# Patient Record
Sex: Female | Born: 1960 | Race: White | Hispanic: No | Marital: Single
Health system: Southern US, Community
[De-identification: ages and names within clinical notes are randomized; demographics above are authoritative.]

## PROBLEM LIST (undated history)

## (undated) DIAGNOSIS — M1990 Unspecified osteoarthritis, unspecified site: Secondary | ICD-10-CM

## (undated) DIAGNOSIS — E079 Disorder of thyroid, unspecified: Secondary | ICD-10-CM

## (undated) DIAGNOSIS — I10 Essential (primary) hypertension: Secondary | ICD-10-CM

## (undated) HISTORY — PX: ABDOMINAL HYSTERECTOMY: SHX81

## (undated) HISTORY — PX: APPENDECTOMY: SHX54

## (undated) HISTORY — PX: SKIN GRAFT: SHX250

## (undated) HISTORY — PX: THYROIDECTOMY, PARTIAL: SHX18

## (undated) HISTORY — PX: CYST REMOVAL HAND: SHX6279

---

## 2017-07-02 DIAGNOSIS — Z780 Asymptomatic menopausal state: Secondary | ICD-10-CM | POA: Diagnosis not present

## 2017-07-02 DIAGNOSIS — I10 Essential (primary) hypertension: Secondary | ICD-10-CM | POA: Diagnosis not present

## 2017-07-02 DIAGNOSIS — Z1231 Encounter for screening mammogram for malignant neoplasm of breast: Secondary | ICD-10-CM | POA: Diagnosis not present

## 2017-07-02 DIAGNOSIS — E039 Hypothyroidism, unspecified: Secondary | ICD-10-CM | POA: Diagnosis not present

## 2017-07-02 DIAGNOSIS — E538 Deficiency of other specified B group vitamins: Secondary | ICD-10-CM | POA: Diagnosis not present

## 2017-07-02 DIAGNOSIS — H547 Unspecified visual loss: Secondary | ICD-10-CM | POA: Diagnosis not present

## 2017-07-23 ENCOUNTER — Other Ambulatory Visit: Payer: Self-pay | Admitting: Internal Medicine

## 2017-07-23 DIAGNOSIS — E039 Hypothyroidism, unspecified: Secondary | ICD-10-CM | POA: Diagnosis not present

## 2017-07-23 DIAGNOSIS — I10 Essential (primary) hypertension: Secondary | ICD-10-CM | POA: Diagnosis not present

## 2017-07-23 DIAGNOSIS — E041 Nontoxic single thyroid nodule: Secondary | ICD-10-CM | POA: Diagnosis not present

## 2017-07-23 DIAGNOSIS — E538 Deficiency of other specified B group vitamins: Secondary | ICD-10-CM | POA: Diagnosis not present

## 2017-07-24 ENCOUNTER — Ambulatory Visit
Admission: RE | Admit: 2017-07-24 | Discharge: 2017-07-24 | Disposition: A | Discharge status: Home / Self Care | Payer: Medicare Other | Source: Ambulatory Visit | Attending: Internal Medicine | Admitting: Internal Medicine

## 2017-07-24 DIAGNOSIS — E041 Nontoxic single thyroid nodule: Secondary | ICD-10-CM

## 2017-08-14 DIAGNOSIS — H47323 Drusen of optic disc, bilateral: Secondary | ICD-10-CM | POA: Diagnosis not present

## 2017-08-14 DIAGNOSIS — H35033 Hypertensive retinopathy, bilateral: Secondary | ICD-10-CM | POA: Diagnosis not present

## 2017-10-22 ENCOUNTER — Other Ambulatory Visit: Payer: Self-pay | Admitting: Internal Medicine

## 2017-10-22 DIAGNOSIS — Z1231 Encounter for screening mammogram for malignant neoplasm of breast: Secondary | ICD-10-CM

## 2017-10-22 DIAGNOSIS — Z0000 Encounter for general adult medical examination without abnormal findings: Secondary | ICD-10-CM | POA: Diagnosis not present

## 2017-10-22 DIAGNOSIS — E039 Hypothyroidism, unspecified: Secondary | ICD-10-CM | POA: Diagnosis not present

## 2017-10-22 DIAGNOSIS — Z23 Encounter for immunization: Secondary | ICD-10-CM | POA: Diagnosis not present

## 2017-10-22 DIAGNOSIS — E041 Nontoxic single thyroid nodule: Secondary | ICD-10-CM | POA: Diagnosis not present

## 2017-10-22 DIAGNOSIS — E538 Deficiency of other specified B group vitamins: Secondary | ICD-10-CM | POA: Diagnosis not present

## 2017-11-22 ENCOUNTER — Ambulatory Visit
Admission: RE | Admit: 2017-11-22 | Discharge: 2017-11-22 | Disposition: A | Discharge status: Home / Self Care | Payer: Medicare Other | Source: Ambulatory Visit | Attending: Internal Medicine | Admitting: Internal Medicine

## 2017-11-22 ENCOUNTER — Encounter (INDEPENDENT_AMBULATORY_CARE_PROVIDER_SITE_OTHER): Payer: Self-pay

## 2017-11-22 DIAGNOSIS — Z1231 Encounter for screening mammogram for malignant neoplasm of breast: Secondary | ICD-10-CM | POA: Diagnosis not present

## 2018-01-15 DIAGNOSIS — Z780 Asymptomatic menopausal state: Secondary | ICD-10-CM | POA: Diagnosis not present

## 2018-01-15 DIAGNOSIS — E039 Hypothyroidism, unspecified: Secondary | ICD-10-CM | POA: Diagnosis not present

## 2018-01-15 DIAGNOSIS — Z0000 Encounter for general adult medical examination without abnormal findings: Secondary | ICD-10-CM | POA: Diagnosis not present

## 2018-01-15 DIAGNOSIS — I10 Essential (primary) hypertension: Secondary | ICD-10-CM | POA: Diagnosis not present

## 2018-01-15 DIAGNOSIS — R945 Abnormal results of liver function studies: Secondary | ICD-10-CM | POA: Diagnosis not present

## 2018-01-15 DIAGNOSIS — Z7189 Other specified counseling: Secondary | ICD-10-CM | POA: Diagnosis not present

## 2018-01-15 DIAGNOSIS — E538 Deficiency of other specified B group vitamins: Secondary | ICD-10-CM | POA: Diagnosis not present

## 2018-01-15 DIAGNOSIS — Z1389 Encounter for screening for other disorder: Secondary | ICD-10-CM | POA: Diagnosis not present

## 2018-02-18 ENCOUNTER — Other Ambulatory Visit: Payer: Self-pay | Admitting: Internal Medicine

## 2018-02-18 DIAGNOSIS — G629 Polyneuropathy, unspecified: Secondary | ICD-10-CM | POA: Diagnosis not present

## 2018-02-18 DIAGNOSIS — R7989 Other specified abnormal findings of blood chemistry: Secondary | ICD-10-CM

## 2018-02-18 DIAGNOSIS — R945 Abnormal results of liver function studies: Principal | ICD-10-CM

## 2018-02-18 DIAGNOSIS — R740 Nonspecific elevation of levels of transaminase and lactic acid dehydrogenase [LDH]: Secondary | ICD-10-CM | POA: Diagnosis not present

## 2018-02-18 DIAGNOSIS — I872 Venous insufficiency (chronic) (peripheral): Secondary | ICD-10-CM | POA: Diagnosis not present

## 2018-02-19 ENCOUNTER — Ambulatory Visit
Admission: RE | Admit: 2018-02-19 | Discharge: 2018-02-19 | Disposition: A | Discharge status: Home / Self Care | Payer: Medicare Other | Source: Ambulatory Visit | Attending: Internal Medicine | Admitting: Internal Medicine

## 2018-02-19 DIAGNOSIS — R945 Abnormal results of liver function studies: Principal | ICD-10-CM

## 2018-02-19 DIAGNOSIS — R7989 Other specified abnormal findings of blood chemistry: Secondary | ICD-10-CM

## 2018-02-19 DIAGNOSIS — K8020 Calculus of gallbladder without cholecystitis without obstruction: Secondary | ICD-10-CM | POA: Diagnosis not present

## 2018-02-25 ENCOUNTER — Other Ambulatory Visit: Payer: Self-pay | Admitting: Internal Medicine

## 2018-02-25 DIAGNOSIS — R740 Nonspecific elevation of levels of transaminase and lactic acid dehydrogenase [LDH]: Principal | ICD-10-CM

## 2018-02-25 DIAGNOSIS — R7401 Elevation of levels of liver transaminase levels: Secondary | ICD-10-CM

## 2018-02-26 ENCOUNTER — Ambulatory Visit
Admission: RE | Admit: 2018-02-26 | Discharge: 2018-02-26 | Disposition: A | Discharge status: Home / Self Care | Payer: Medicare Other | Source: Ambulatory Visit | Attending: Internal Medicine | Admitting: Internal Medicine

## 2018-02-26 ENCOUNTER — Other Ambulatory Visit: Payer: Medicare Other

## 2018-02-26 DIAGNOSIS — R7989 Other specified abnormal findings of blood chemistry: Secondary | ICD-10-CM | POA: Diagnosis not present

## 2018-02-26 DIAGNOSIS — K8020 Calculus of gallbladder without cholecystitis without obstruction: Secondary | ICD-10-CM | POA: Diagnosis not present

## 2018-02-26 DIAGNOSIS — R7401 Elevation of levels of liver transaminase levels: Secondary | ICD-10-CM

## 2018-02-26 DIAGNOSIS — R740 Nonspecific elevation of levels of transaminase and lactic acid dehydrogenase [LDH]: Principal | ICD-10-CM

## 2018-02-26 MED ORDER — IOPAMIDOL (ISOVUE-300) INJECTION 61%
125.0000 mL | Freq: Once | INTRAVENOUS | Status: AC | PRN
  Administered 2018-02-26: 125 mL via INTRAVENOUS

## 2018-03-05 DIAGNOSIS — K760 Fatty (change of) liver, not elsewhere classified: Secondary | ICD-10-CM | POA: Diagnosis not present

## 2018-03-05 DIAGNOSIS — R748 Abnormal levels of other serum enzymes: Secondary | ICD-10-CM | POA: Diagnosis not present

## 2018-03-05 DIAGNOSIS — Z1211 Encounter for screening for malignant neoplasm of colon: Secondary | ICD-10-CM | POA: Diagnosis not present

## 2018-07-16 DIAGNOSIS — N200 Calculus of kidney: Secondary | ICD-10-CM | POA: Diagnosis not present

## 2018-07-16 DIAGNOSIS — K760 Fatty (change of) liver, not elsewhere classified: Secondary | ICD-10-CM | POA: Diagnosis not present

## 2018-07-16 DIAGNOSIS — I10 Essential (primary) hypertension: Secondary | ICD-10-CM | POA: Diagnosis not present

## 2018-07-16 DIAGNOSIS — E039 Hypothyroidism, unspecified: Secondary | ICD-10-CM | POA: Diagnosis not present

## 2018-07-16 DIAGNOSIS — R748 Abnormal levels of other serum enzymes: Secondary | ICD-10-CM | POA: Diagnosis not present

## 2018-07-16 DIAGNOSIS — Z1211 Encounter for screening for malignant neoplasm of colon: Secondary | ICD-10-CM | POA: Diagnosis not present

## 2018-08-13 DIAGNOSIS — D124 Benign neoplasm of descending colon: Secondary | ICD-10-CM | POA: Diagnosis not present

## 2018-08-13 DIAGNOSIS — Z1211 Encounter for screening for malignant neoplasm of colon: Secondary | ICD-10-CM | POA: Diagnosis not present

## 2018-08-16 DIAGNOSIS — D124 Benign neoplasm of descending colon: Secondary | ICD-10-CM | POA: Diagnosis not present

## 2018-09-24 DIAGNOSIS — N202 Calculus of kidney with calculus of ureter: Secondary | ICD-10-CM | POA: Diagnosis not present

## 2018-11-19 DIAGNOSIS — Z23 Encounter for immunization: Secondary | ICD-10-CM | POA: Diagnosis not present

## 2019-02-04 ENCOUNTER — Other Ambulatory Visit: Payer: Self-pay | Admitting: Internal Medicine

## 2019-02-04 DIAGNOSIS — Z1231 Encounter for screening mammogram for malignant neoplasm of breast: Secondary | ICD-10-CM

## 2019-02-05 ENCOUNTER — Ambulatory Visit
Admission: RE | Admit: 2019-02-05 | Discharge: 2019-02-05 | Disposition: A | Discharge status: Home / Self Care | Payer: Medicare Other | Source: Ambulatory Visit

## 2019-02-05 ENCOUNTER — Other Ambulatory Visit: Payer: Self-pay

## 2019-02-05 DIAGNOSIS — Z1231 Encounter for screening mammogram for malignant neoplasm of breast: Secondary | ICD-10-CM

## 2019-04-24 ENCOUNTER — Ambulatory Visit: Payer: Medicare Other | Attending: Internal Medicine

## 2019-04-24 DIAGNOSIS — Z23 Encounter for immunization: Secondary | ICD-10-CM

## 2019-04-24 NOTE — Progress Notes (Signed)
   Covid-19 Vaccination Clinic  Name:  Pamela Jenkins    MRN: [id number] DOB: 21-Nov-1960  04/24/2019  [name] was observed post Covid-19 immunization for 30 minutes based on pre-vaccination screening without incident. She was provided with Vaccine Information Sheet and instruction to access the V-Safe system.   [name] was instructed to call 911 with any severe reactions post vaccine: Marland Kitchen Difficulty breathing  . Swelling of face and throat  . A fast heartbeat  . A bad rash all over body  . Dizziness and weakness   Immunizations Administered    Name Date Dose VIS Date Route   Pfizer COVID-19 Vaccine 04/24/2019  1:00 PM 0.3 mL 01/10/2019 Intramuscular   Manufacturer: [place]   Lot: [id number]   [place]: [id number]

## 2019-05-19 ENCOUNTER — Ambulatory Visit: Payer: Medicare Other | Attending: Internal Medicine

## 2019-05-19 DIAGNOSIS — Z23 Encounter for immunization: Secondary | ICD-10-CM

## 2019-05-19 NOTE — Progress Notes (Signed)
   Covid-19 Vaccination Clinic  Name:  Artice Goudeau    MRN: [id number] DOB: [birth year]  05/19/2019  Ms. Pross was observed post Covid-19 immunization for 30 minutes based on pre-vaccination screening without incident. She was provided with Vaccine Information Sheet and instruction to access the V-Safe system.   Ms. Hafen was instructed to call 911 with any severe reactions post vaccine: Marland Kitchen Difficulty breathing  . Swelling of face and throat  . A fast heartbeat  . A bad rash all over body  . Dizziness and weakness   Immunizations Administered    Name Date Dose VIS Date Route   Pfizer COVID-19 Vaccine 05/19/2019 10:58 AM 0.3 mL 03/26/2018 Intramuscular   Manufacturer: [place]   Lot: [id number]   [place]: [id number]

## 2020-02-13 ENCOUNTER — Other Ambulatory Visit: Payer: Self-pay | Admitting: Internal Medicine

## 2020-02-13 DIAGNOSIS — Z1231 Encounter for screening mammogram for malignant neoplasm of breast: Secondary | ICD-10-CM

## 2020-02-18 DIAGNOSIS — M1990 Unspecified osteoarthritis, unspecified site: Secondary | ICD-10-CM | POA: Diagnosis not present

## 2020-02-18 DIAGNOSIS — E039 Hypothyroidism, unspecified: Secondary | ICD-10-CM | POA: Diagnosis not present

## 2020-02-18 DIAGNOSIS — I10 Essential (primary) hypertension: Secondary | ICD-10-CM | POA: Diagnosis not present

## 2020-03-03 ENCOUNTER — Ambulatory Visit
Admission: RE | Admit: 2020-03-03 | Discharge: 2020-03-03 | Disposition: A | Discharge status: Home / Self Care | Payer: Medicare Other | Source: Ambulatory Visit

## 2020-03-03 ENCOUNTER — Other Ambulatory Visit: Payer: Self-pay

## 2020-03-03 DIAGNOSIS — Z1231 Encounter for screening mammogram for malignant neoplasm of breast: Secondary | ICD-10-CM

## 2020-03-18 ENCOUNTER — Other Ambulatory Visit: Payer: Self-pay | Admitting: Internal Medicine

## 2020-03-18 DIAGNOSIS — R739 Hyperglycemia, unspecified: Secondary | ICD-10-CM | POA: Diagnosis not present

## 2020-03-18 DIAGNOSIS — F3342 Major depressive disorder, recurrent, in full remission: Secondary | ICD-10-CM | POA: Diagnosis not present

## 2020-03-18 DIAGNOSIS — Z7189 Other specified counseling: Secondary | ICD-10-CM | POA: Diagnosis not present

## 2020-03-18 DIAGNOSIS — I10 Essential (primary) hypertension: Secondary | ICD-10-CM | POA: Diagnosis not present

## 2020-03-18 DIAGNOSIS — Z0000 Encounter for general adult medical examination without abnormal findings: Secondary | ICD-10-CM | POA: Diagnosis not present

## 2020-03-18 DIAGNOSIS — E2839 Other primary ovarian failure: Secondary | ICD-10-CM | POA: Diagnosis not present

## 2020-03-18 DIAGNOSIS — E559 Vitamin D deficiency, unspecified: Secondary | ICD-10-CM | POA: Diagnosis not present

## 2020-03-18 DIAGNOSIS — R7309 Other abnormal glucose: Secondary | ICD-10-CM | POA: Diagnosis not present

## 2020-03-18 DIAGNOSIS — Z1389 Encounter for screening for other disorder: Secondary | ICD-10-CM | POA: Diagnosis not present

## 2020-06-21 IMAGING — US US THYROID
1 series · 14 of 25 positions shown · non-contrast
Comparison: None available

CLINICAL DATA: Right nodule. History of biopsy at outside
institution. Previous left thyroid lobectomy for benign disease

EXAM:
THYROID ULTRASOUND
TECHNIQUE: Ultrasound examination of the thyroid gland and adjacent soft
tissues was performed.

[Series 1: us thyroid · 0.06mm/px · 36 acquisitions, 14 frames shown]
[im 1/36]
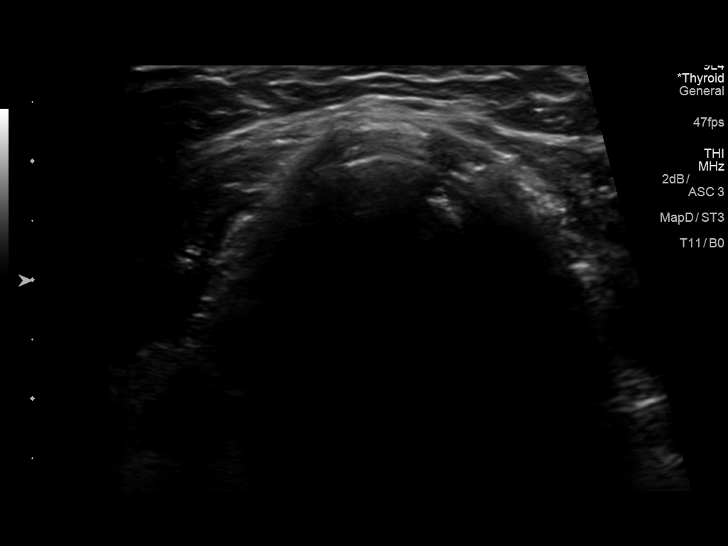
[im 3/36]
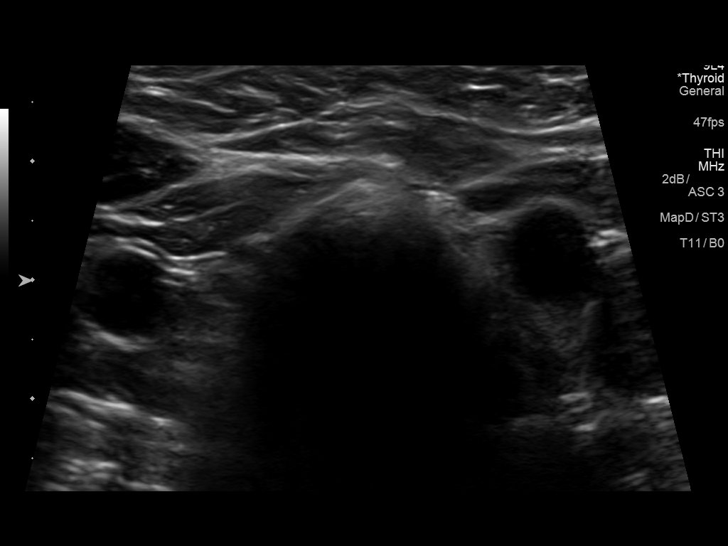
[im 6/36]
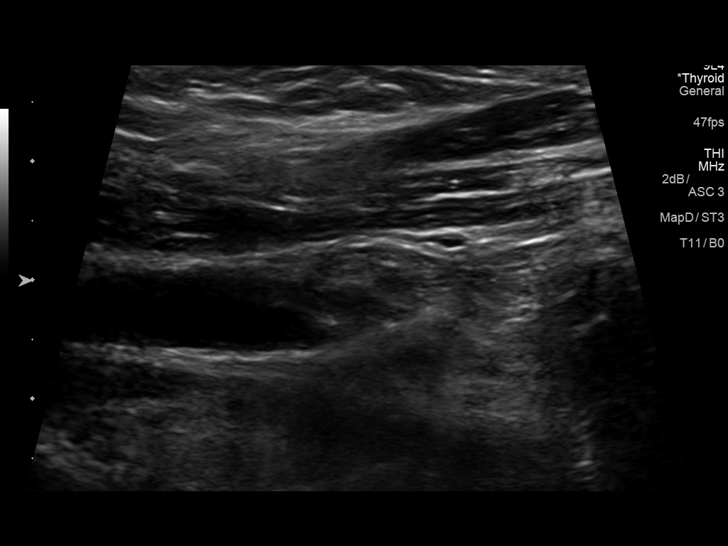
[im 9/36]
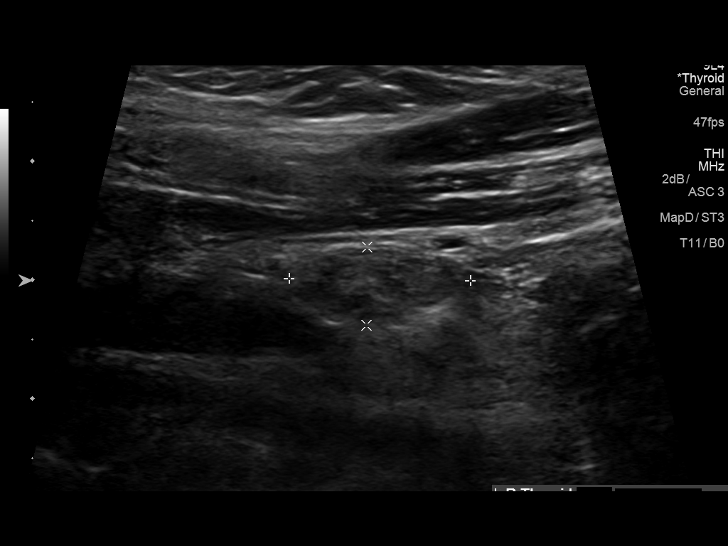
[im 12/36]
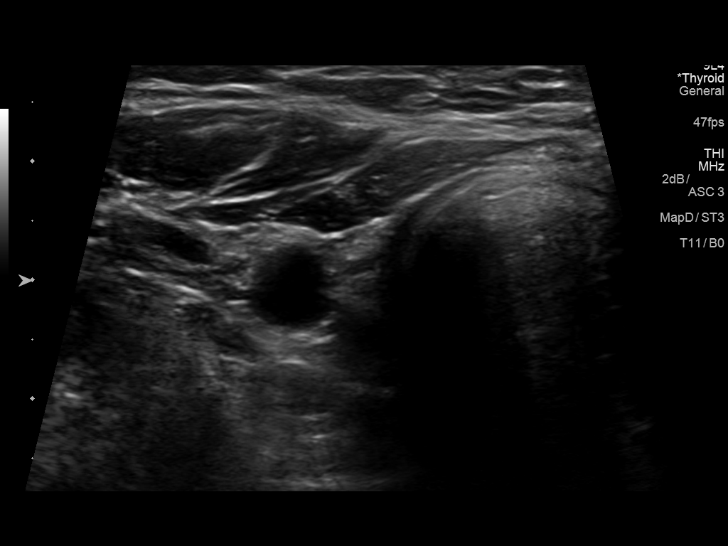
[im 14/36]
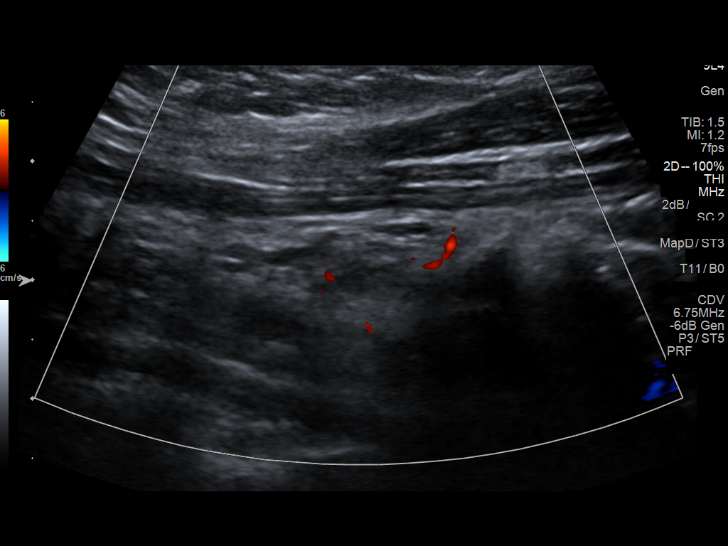
[im 17/36]
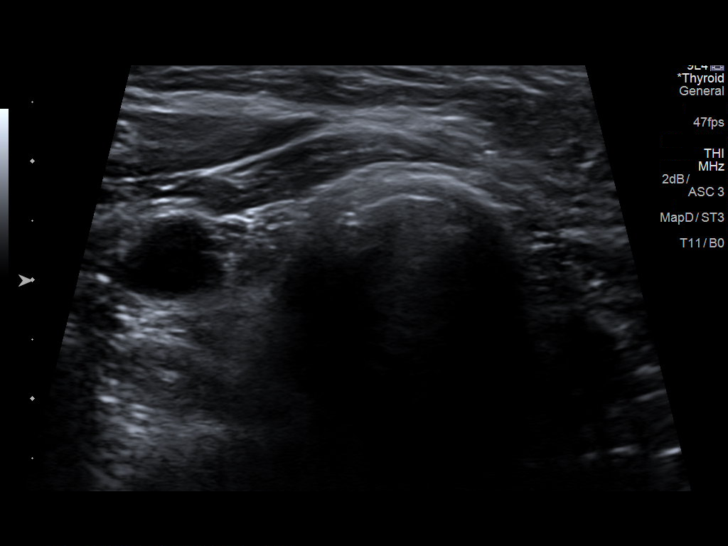
[im 19/36]
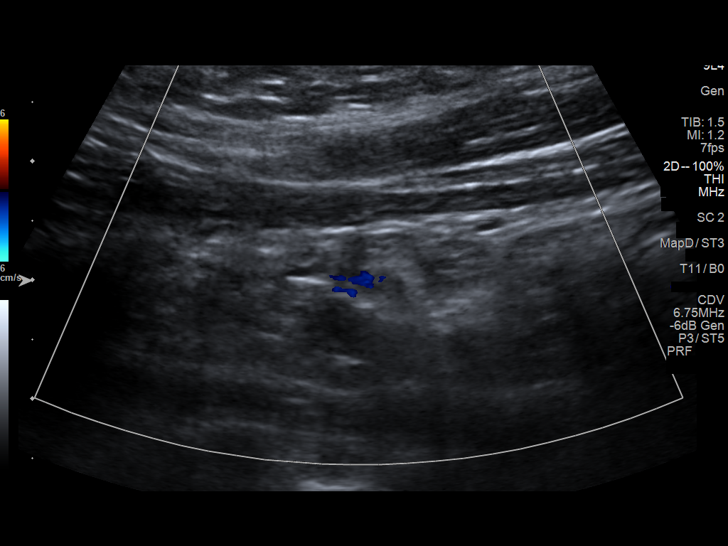
[im 22/36]
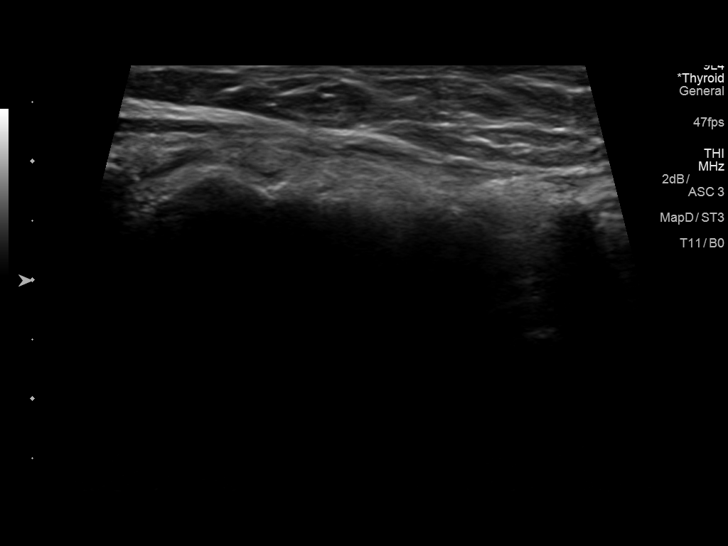
[im 24/36]
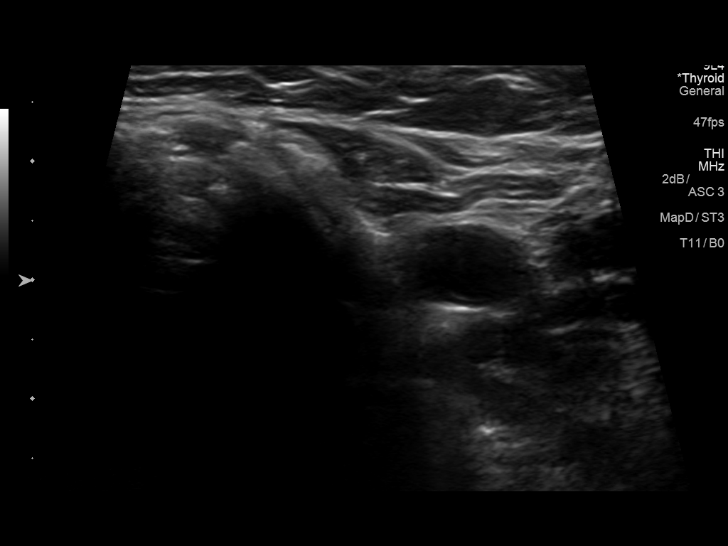
[im 27/36]
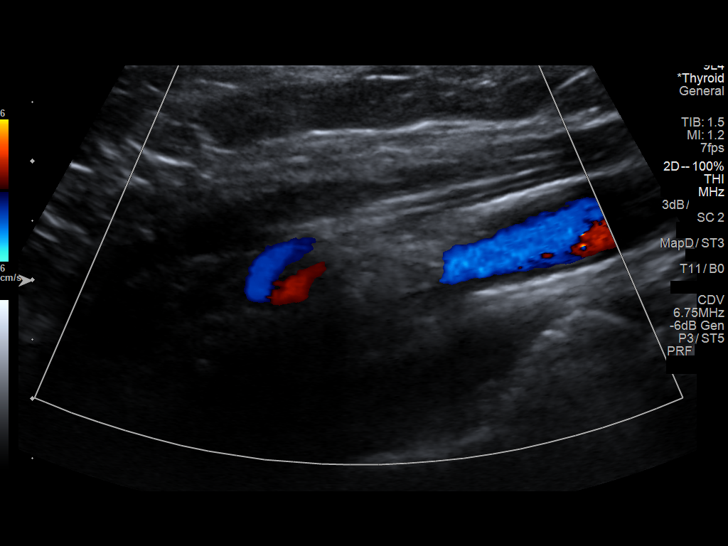
[im 30/36]
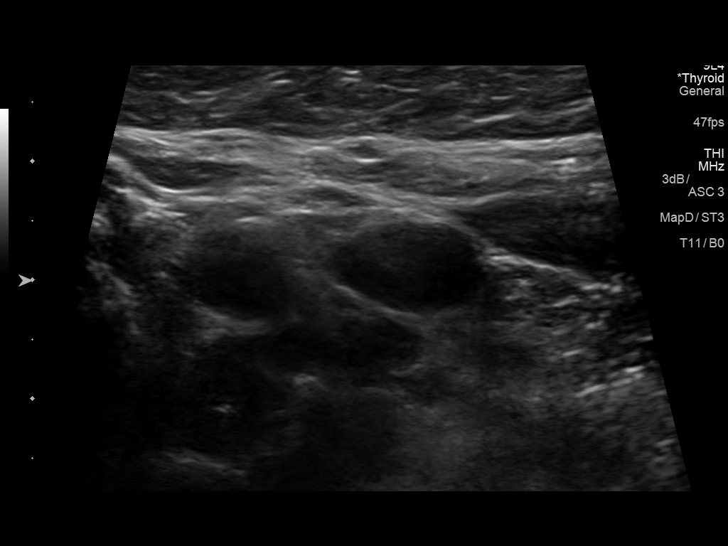
[im 33/36]
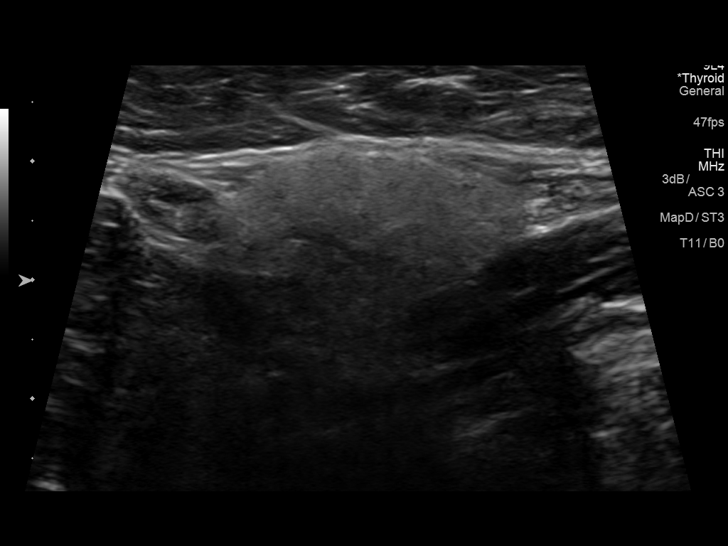
[im 36/36]
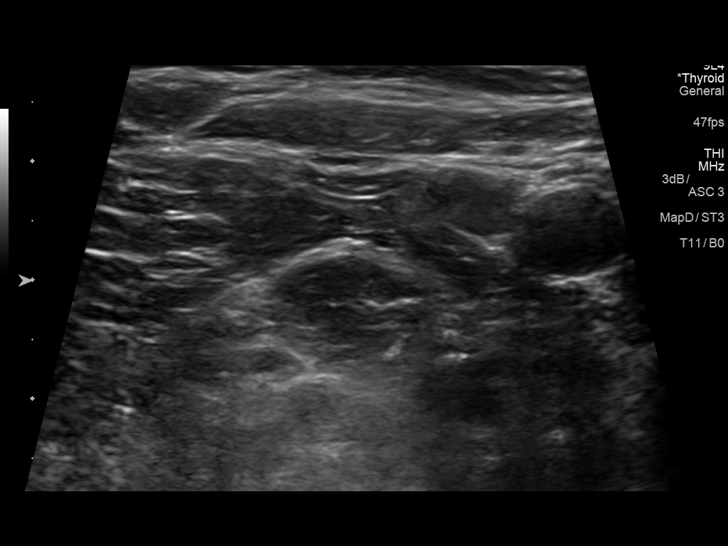

[14 of 25 positions shown; findings below may reference images not displayed]

FINDINGS: Parenchymal Echotexture: Mildly heterogenous

Isthmus: Not visualized

Right lobe: 1.5 x 0.7 x 0.6 cm

Left lobe: Surgically absent

_________________________________________________________

Estimated total number of nodules >/= 1 cm: 0

Number of spongiform nodules >/=  2 cm not described below (TR1): 0

Number of mixed cystic and solid nodules >/= 1.5 cm not described
below (TR2): 0

_________________________________________________________

No discrete nodules are seen within the thyroid gland.
IMPRESSION: 1. No residual/recurrent tissue post left hemithyroidectomy.
2. Atrophic heterogeneous right lobe.  No nodule.

The above is in keeping with the ACR TI-RADS recommendations - [HOSPITAL] 7895;[DATE].

## 2020-08-20 ENCOUNTER — Other Ambulatory Visit: Payer: Self-pay

## 2020-08-20 ENCOUNTER — Ambulatory Visit
Admission: RE | Admit: 2020-08-20 | Discharge: 2020-08-20 | Disposition: A | Discharge status: Home / Self Care | Payer: Medicare Other | Source: Ambulatory Visit | Attending: Internal Medicine | Admitting: Internal Medicine

## 2020-08-20 DIAGNOSIS — E2839 Other primary ovarian failure: Secondary | ICD-10-CM

## 2020-08-20 DIAGNOSIS — Z780 Asymptomatic menopausal state: Secondary | ICD-10-CM | POA: Diagnosis not present

## 2020-08-25 ENCOUNTER — Ambulatory Visit
Admission: RE | Admit: 2020-08-25 | Discharge: 2020-08-25 | Disposition: A | Discharge status: Home / Self Care | Payer: Medicare Other | Source: Ambulatory Visit | Attending: Internal Medicine | Admitting: Internal Medicine

## 2020-08-25 ENCOUNTER — Other Ambulatory Visit: Payer: Self-pay | Admitting: Internal Medicine

## 2020-08-25 ENCOUNTER — Other Ambulatory Visit: Payer: Self-pay

## 2020-08-25 DIAGNOSIS — F3342 Major depressive disorder, recurrent, in full remission: Secondary | ICD-10-CM | POA: Diagnosis not present

## 2020-08-25 DIAGNOSIS — M4312 Spondylolisthesis, cervical region: Secondary | ICD-10-CM | POA: Diagnosis not present

## 2020-08-25 DIAGNOSIS — M5441 Lumbago with sciatica, right side: Secondary | ICD-10-CM | POA: Diagnosis not present

## 2020-08-25 DIAGNOSIS — M62838 Other muscle spasm: Secondary | ICD-10-CM | POA: Diagnosis not present

## 2020-08-25 DIAGNOSIS — M5442 Lumbago with sciatica, left side: Secondary | ICD-10-CM

## 2020-08-25 DIAGNOSIS — M47812 Spondylosis without myelopathy or radiculopathy, cervical region: Secondary | ICD-10-CM | POA: Diagnosis not present

## 2020-08-25 DIAGNOSIS — G8929 Other chronic pain: Secondary | ICD-10-CM | POA: Diagnosis not present

## 2020-08-25 DIAGNOSIS — M545 Low back pain, unspecified: Secondary | ICD-10-CM | POA: Diagnosis not present

## 2020-08-25 DIAGNOSIS — M79645 Pain in left finger(s): Secondary | ICD-10-CM | POA: Diagnosis not present

## 2020-08-26 DIAGNOSIS — H2513 Age-related nuclear cataract, bilateral: Secondary | ICD-10-CM | POA: Diagnosis not present

## 2020-08-26 DIAGNOSIS — H47323 Drusen of optic disc, bilateral: Secondary | ICD-10-CM | POA: Diagnosis not present

## 2020-09-15 DIAGNOSIS — E039 Hypothyroidism, unspecified: Secondary | ICD-10-CM | POA: Diagnosis not present

## 2020-09-15 DIAGNOSIS — M62838 Other muscle spasm: Secondary | ICD-10-CM | POA: Diagnosis not present

## 2020-09-15 DIAGNOSIS — H547 Unspecified visual loss: Secondary | ICD-10-CM | POA: Diagnosis not present

## 2020-09-15 DIAGNOSIS — I10 Essential (primary) hypertension: Secondary | ICD-10-CM | POA: Diagnosis not present

## 2020-12-29 DIAGNOSIS — Z23 Encounter for immunization: Secondary | ICD-10-CM | POA: Diagnosis not present

## 2021-02-08 ENCOUNTER — Other Ambulatory Visit: Payer: Self-pay | Admitting: Internal Medicine

## 2021-02-08 DIAGNOSIS — Z1231 Encounter for screening mammogram for malignant neoplasm of breast: Secondary | ICD-10-CM

## 2021-03-04 ENCOUNTER — Ambulatory Visit
Admission: RE | Admit: 2021-03-04 | Discharge: 2021-03-04 | Disposition: A | Discharge status: Home / Self Care | Payer: Medicare Other | Source: Ambulatory Visit

## 2021-03-04 DIAGNOSIS — Z1231 Encounter for screening mammogram for malignant neoplasm of breast: Secondary | ICD-10-CM | POA: Diagnosis not present

## 2021-03-29 DIAGNOSIS — E039 Hypothyroidism, unspecified: Secondary | ICD-10-CM | POA: Diagnosis not present

## 2021-03-29 DIAGNOSIS — I10 Essential (primary) hypertension: Secondary | ICD-10-CM | POA: Diagnosis not present

## 2021-03-29 DIAGNOSIS — Z0000 Encounter for general adult medical examination without abnormal findings: Secondary | ICD-10-CM | POA: Diagnosis not present

## 2021-03-29 DIAGNOSIS — H547 Unspecified visual loss: Secondary | ICD-10-CM | POA: Diagnosis not present

## 2021-03-29 DIAGNOSIS — M150 Primary generalized (osteo)arthritis: Secondary | ICD-10-CM | POA: Diagnosis not present

## 2021-03-29 DIAGNOSIS — E559 Vitamin D deficiency, unspecified: Secondary | ICD-10-CM | POA: Diagnosis not present

## 2021-03-29 DIAGNOSIS — F3340 Major depressive disorder, recurrent, in remission, unspecified: Secondary | ICD-10-CM | POA: Diagnosis not present

## 2021-03-29 DIAGNOSIS — N200 Calculus of kidney: Secondary | ICD-10-CM | POA: Diagnosis not present

## 2021-03-29 DIAGNOSIS — F3342 Major depressive disorder, recurrent, in full remission: Secondary | ICD-10-CM | POA: Diagnosis not present

## 2021-03-29 DIAGNOSIS — I872 Venous insufficiency (chronic) (peripheral): Secondary | ICD-10-CM | POA: Diagnosis not present

## 2021-03-29 DIAGNOSIS — Z23 Encounter for immunization: Secondary | ICD-10-CM | POA: Diagnosis not present

## 2021-03-29 DIAGNOSIS — K760 Fatty (change of) liver, not elsewhere classified: Secondary | ICD-10-CM | POA: Diagnosis not present

## 2021-03-29 DIAGNOSIS — Z90710 Acquired absence of both cervix and uterus: Secondary | ICD-10-CM | POA: Diagnosis not present

## 2021-03-29 DIAGNOSIS — R2689 Other abnormalities of gait and mobility: Secondary | ICD-10-CM | POA: Diagnosis not present

## 2021-04-12 DIAGNOSIS — E039 Hypothyroidism, unspecified: Secondary | ICD-10-CM | POA: Diagnosis not present

## 2021-04-12 DIAGNOSIS — E875 Hyperkalemia: Secondary | ICD-10-CM | POA: Diagnosis not present

## 2021-06-22 ENCOUNTER — Other Ambulatory Visit: Payer: Self-pay | Admitting: Internal Medicine

## 2021-06-22 ENCOUNTER — Ambulatory Visit
Admission: RE | Admit: 2021-06-22 | Discharge: 2021-06-22 | Disposition: A | Discharge status: Home / Self Care | Payer: Medicare Other | Source: Ambulatory Visit | Attending: Internal Medicine | Admitting: Internal Medicine

## 2021-06-22 DIAGNOSIS — M25562 Pain in left knee: Secondary | ICD-10-CM | POA: Diagnosis not present

## 2021-06-22 DIAGNOSIS — I10 Essential (primary) hypertension: Secondary | ICD-10-CM | POA: Diagnosis not present

## 2021-06-22 DIAGNOSIS — R262 Difficulty in walking, not elsewhere classified: Secondary | ICD-10-CM | POA: Diagnosis not present

## 2021-06-30 DIAGNOSIS — M25562 Pain in left knee: Secondary | ICD-10-CM | POA: Diagnosis not present

## 2021-08-26 DIAGNOSIS — R262 Difficulty in walking, not elsewhere classified: Secondary | ICD-10-CM | POA: Diagnosis not present

## 2021-08-26 DIAGNOSIS — I10 Essential (primary) hypertension: Secondary | ICD-10-CM | POA: Diagnosis not present

## 2021-08-26 DIAGNOSIS — F3340 Major depressive disorder, recurrent, in remission, unspecified: Secondary | ICD-10-CM | POA: Diagnosis not present

## 2021-08-26 DIAGNOSIS — M1990 Unspecified osteoarthritis, unspecified site: Secondary | ICD-10-CM | POA: Diagnosis not present

## 2021-11-29 DIAGNOSIS — E876 Hypokalemia: Secondary | ICD-10-CM | POA: Diagnosis not present

## 2021-11-29 DIAGNOSIS — I10 Essential (primary) hypertension: Secondary | ICD-10-CM | POA: Diagnosis not present

## 2021-11-29 DIAGNOSIS — Z23 Encounter for immunization: Secondary | ICD-10-CM | POA: Diagnosis not present

## 2021-11-29 DIAGNOSIS — R002 Palpitations: Secondary | ICD-10-CM | POA: Diagnosis not present

## 2021-11-29 DIAGNOSIS — I498 Other specified cardiac arrhythmias: Secondary | ICD-10-CM | POA: Diagnosis not present

## 2021-11-29 DIAGNOSIS — E039 Hypothyroidism, unspecified: Secondary | ICD-10-CM | POA: Diagnosis not present

## 2021-11-30 DIAGNOSIS — Z23 Encounter for immunization: Secondary | ICD-10-CM | POA: Diagnosis not present

## 2021-12-14 DIAGNOSIS — R002 Palpitations: Secondary | ICD-10-CM | POA: Diagnosis not present

## 2021-12-16 ENCOUNTER — Observation Stay (HOSPITAL_COMMUNITY)
Admission: EM | Admit: 2021-12-16 | Discharge: 2021-12-17 | Disposition: A | Discharge status: Home / Self Care | Payer: Medicare Other | Attending: Internal Medicine | Admitting: Internal Medicine

## 2021-12-16 ENCOUNTER — Other Ambulatory Visit: Payer: Self-pay

## 2021-12-16 ENCOUNTER — Encounter (HOSPITAL_COMMUNITY): Payer: Self-pay

## 2021-12-16 ENCOUNTER — Emergency Department (HOSPITAL_COMMUNITY): Payer: Medicare Other

## 2021-12-16 DIAGNOSIS — I498 Other specified cardiac arrhythmias: Secondary | ICD-10-CM | POA: Diagnosis not present

## 2021-12-16 DIAGNOSIS — R0789 Other chest pain: Secondary | ICD-10-CM | POA: Diagnosis not present

## 2021-12-16 DIAGNOSIS — R002 Palpitations: Secondary | ICD-10-CM | POA: Insufficient documentation

## 2021-12-16 DIAGNOSIS — E559 Vitamin D deficiency, unspecified: Secondary | ICD-10-CM | POA: Diagnosis not present

## 2021-12-16 DIAGNOSIS — R079 Chest pain, unspecified: Principal | ICD-10-CM

## 2021-12-16 DIAGNOSIS — R0602 Shortness of breath: Secondary | ICD-10-CM | POA: Insufficient documentation

## 2021-12-16 DIAGNOSIS — I499 Cardiac arrhythmia, unspecified: Secondary | ICD-10-CM | POA: Diagnosis present

## 2021-12-16 HISTORY — DX: Disorder of thyroid, unspecified: E07.9

## 2021-12-16 HISTORY — DX: Essential (primary) hypertension: I10

## 2021-12-16 HISTORY — DX: Unspecified osteoarthritis, unspecified site: M19.90

## 2021-12-16 LAB — CBC WITH DIFFERENTIAL/PLATELET
Abs Immature Granulocytes: 0.02 K/uL (ref 0.00–0.07)
Basophils Absolute: 0.1 K/uL (ref 0.0–0.1)
Basophils Relative: 1 %
Eosinophils Absolute: 0.2 K/uL (ref 0.0–0.5)
Eosinophils Relative: 3 %
HCT: 44.3 % (ref 36.0–46.0)
Hemoglobin: 15 g/dL (ref 12.0–15.0)
Immature Granulocytes: 0 %
Lymphocytes Relative: 31 %
Lymphs Abs: 2 K/uL (ref 0.7–4.0)
MCH: 31.3 pg (ref 26.0–34.0)
MCHC: 33.9 g/dL (ref 30.0–36.0)
MCV: 92.3 fL (ref 80.0–100.0)
Monocytes Absolute: 0.7 K/uL (ref 0.1–1.0)
Monocytes Relative: 10 %
Neutro Abs: 3.6 K/uL (ref 1.7–7.7)
Neutrophils Relative %: 55 %
Platelets: 202 K/uL (ref 150–400)
RBC: 4.8 MIL/uL (ref 3.87–5.11)
RDW: 12 % (ref 11.5–15.5)
WBC: 6.5 K/uL (ref 4.0–10.5)
nRBC: 0 % (ref 0.0–0.2)

## 2021-12-16 LAB — COMPREHENSIVE METABOLIC PANEL
ALT: 19 U/L (ref 0–44)
AST: 25 U/L (ref 15–41)
Albumin: 3.6 g/dL (ref 3.5–5.0)
Alkaline Phosphatase: 73 U/L (ref 38–126)
Anion gap: 8 (ref 5–15)
BUN: 10 mg/dL (ref 8–23)
CO2: 25 mmol/L (ref 22–32)
Calcium: 9.1 mg/dL (ref 8.9–10.3)
Chloride: 106 mmol/L (ref 98–111)
Creatinine, Ser: 1.01 mg/dL — ABNORMAL HIGH (ref 0.44–1.00)
GFR, Estimated: >60 mL/min (ref >60)
Glucose, Bld: 98 mg/dL (ref 70–99)
Potassium: 4.4 mmol/L (ref 3.5–5.1)
Sodium: 139 mmol/L (ref 135–145)
Total Bilirubin: 0.8 mg/dL (ref 0.3–1.2)
Total Protein: 7 g/dL (ref 6.5–8.1)

## 2021-12-16 LAB — TROPONIN I (HIGH SENSITIVITY)
Troponin I (High Sensitivity): 5 ng/L (ref <18)
Troponin I (High Sensitivity): 9 ng/L (ref <18)

## 2021-12-16 NOTE — ED Notes (Signed)
Megan, Pa at bedside to reassess the patient

## 2021-12-16 NOTE — ED Triage Notes (Signed)
Pt is legally blind - she reports CP, SOB, "my heart has been skipping beats," and weakness; onset 2 weeks ago. She has been wearing a heart monitor for 7 days which she states showed SVT, bigeminy and trigeminy.

## 2021-12-16 NOTE — ED Provider Triage Note (Signed)
Emergency Medicine Provider Triage Evaluation Note  Pamela Jenkins , a 61 y.o. female  was evaluated in triage.  Pt complains of chest pain and shortness of breath.  Patient has been having palpitations and skipping beats with weakness onset was 2 weeks ago.  She wore a heart monitor for 7 days which she states showed SVT, bigeminy, and trigeminy.  She was contacted by her provider and told to proceed to the ER if she began to experience chest pain and shortness of breath.  She also endorses lightheadedness and weakness.  Denies fever, chills.  Review of Systems  Positive: See above Negative: See above  Physical Exam  BP (!) 153/78 (BP Location: Left Arm)   Pulse 80   Temp 97.7 F (36.5 C) (Oral)   Resp 20   Ht 5' 7.5" (1.715 m)   Wt 127 kg   SpO2 100%   BMI 43.21 kg/m  Gen:   Awake, no distress   Resp:  Normal effort, lungs clear bilaterally  MSK:   Moves extremities without difficulty  Other:  Heart rate normal, irregular rhythm.    Medical Decision Making  Medically screening exam initiated at 5:46 PM.  Appropriate orders placed.  Yanni Ruberg was informed that the remainder of the evaluation will be completed by another provider, this initial triage assessment does not replace that evaluation, and the importance of remaining in the ED until their evaluation is complete.     Theressa Stamps R, [place] 12/16/21 [phone]

## 2021-12-16 NOTE — ED Provider Notes (Signed)
[facility] Emergency Department Provider Note MRN:  [id number]  Arrival date & time: 12/17/21     Chief Complaint   Chest Pain   History of Present Illness   Pamela Jenkins is a 61 y.o. year-old female presents to the ED with chief complaint of palpitations, CP, and SOB.  States that she has been having intermittent palpitations for a few weeks, which she describes as her heart "flopping around in her chest."  She states that she was seen for this by her PCP and wore a 7-day holter monitor which reportedly showed episodes of SVT, bigeminy and trigeminy.  She states that her doctor put in a referral to cardiology, but she has yet to see them.  She states that she began having chest tightness and SOB earlier today.  These symptoms are still present now.  History provided by patient.   Review of Systems  Pertinent positive and negative review of systems noted in HPI.    Physical Exam   Vitals:   12/16/21 2215 12/16/21 2300  BP: (!) 165/72 135/62  Pulse: 77 70  Resp: 17 15  Temp:    SpO2: 100% 95%    CONSTITUTIONAL:  non toxic-appearing, NAD NEURO:  Alert and oriented x 3, CN 3-12 grossly intact EYES:  blind ENT/NECK:  Supple, no stridor  CARDIO:  normal rate, regular rhythm, appears well-perfused  PULM:  No respiratory distress, CTAB GI/GU:  non-distended, non-tender MSK/SPINE:  No gross deformities, no edema, moves all extremities  SKIN:  no rash, atraumatic   *Additional and/or pertinent findings included in MDM below  Diagnostic and Interventional Summary    EKG Interpretation  Date/Time:  Friday December 16 2021 19:40:23 EST Ventricular Rate:  68 PR Interval:  176 QRS Duration: 80 QT Interval:  402 QTC Calculation: 427 R Axis:   -16 Text Interpretation: Sinus rhythm with occasional Premature ventricular complexes Low voltage QRS Borderline ECG  No significant change from previous EKG today Confirmed by Leanord Asal ([id number]) on  12/16/2021 10:31:02 PM       Labs Reviewed  COMPREHENSIVE METABOLIC PANEL - Abnormal; Notable for the following components:      Result Value   Creatinine, Ser 1.01 (*)    All other components within normal limits  CBC WITH DIFFERENTIAL/PLATELET  TROPONIN I (HIGH SENSITIVITY)  TROPONIN I (HIGH SENSITIVITY)    DG Chest 2 View  Final Result      Medications  nitroGLYCERIN (NITROSTAT) SL tablet 0.4 mg (has no administration in time range)     Procedures  /  Critical Care Procedures  ED Course and Medical Decision Making  I have reviewed the triage vital signs, the nursing notes, and pertinent available records from the EMR.  Social Determinants Affecting Complexity of Care: Patient has no clinically significant social determinants affecting this chief complaint..   ED Course:    Medical Decision Making Patient here with chest pain, shortness of breath, and palpitations.  She states that the chest pain and shortness of breath began today.  She has had palpitations for the past 2 weeks.  Was seen by her PCP and wore a Holter monitor that showed trigeminy, bigeminy, and episodes of SVT.  She states that the chest pain/pressure and shortness of breath is new.  Work-up initiated in triage.  Amount and/or Complexity of Data Reviewed Labs: ordered.    Details: Initial troponin 5, repeat is 9, no leukocytosis, no significant electrolyte derangement Radiology: independent interpretation performed.    Details: No  opacity or effusion on chest x-ray ECG/medicine tests: independent interpretation performed.    Details: No acute ischemic changes, PVCs are seen  Risk Prescription drug management. Decision regarding hospitalization.     Consultants: I discussed the case with Hospitalist, Dr. Memory Dance, who is appreciated for admitting.   Treatment and Plan: Patient's exam and diagnostic results are concerning for chest pain.  Feel that patient will need admission to the hospital  for further treatment and evaluation.    Final Clinical Impressions(s) / ED Diagnoses     ICD-10-CM   1. Chest pain, unspecified type  R07.9     2. Palpitations  R00.2       ED Discharge Orders     None         Discharge Instructions Discussed with and Provided to Patient:   Discharge Instructions   None      Montine Circle, PA-C 12/17/21 0135    Merryl Hacker, MD 12/17/21 [phone]

## 2021-12-17 ENCOUNTER — Inpatient Hospital Stay (HOSPITAL_BASED_OUTPATIENT_CLINIC_OR_DEPARTMENT_OTHER): Payer: Medicare Other

## 2021-12-17 ENCOUNTER — Encounter (HOSPITAL_COMMUNITY): Payer: Self-pay | Admitting: Family Medicine

## 2021-12-17 DIAGNOSIS — R002 Palpitations: Secondary | ICD-10-CM

## 2021-12-17 DIAGNOSIS — I471 Supraventricular tachycardia, unspecified: Secondary | ICD-10-CM | POA: Diagnosis not present

## 2021-12-17 DIAGNOSIS — R079 Chest pain, unspecified: Secondary | ICD-10-CM

## 2021-12-17 DIAGNOSIS — E039 Hypothyroidism, unspecified: Secondary | ICD-10-CM | POA: Diagnosis not present

## 2021-12-17 DIAGNOSIS — I499 Cardiac arrhythmia, unspecified: Secondary | ICD-10-CM | POA: Diagnosis present

## 2021-12-17 DIAGNOSIS — I10 Essential (primary) hypertension: Secondary | ICD-10-CM

## 2021-12-17 LAB — BASIC METABOLIC PANEL
Anion gap: 11 (ref 5–15)
BUN: 10 mg/dL (ref 8–23)
CO2: 21 mmol/L — ABNORMAL LOW (ref 22–32)
Calcium: 8.9 mg/dL (ref 8.9–10.3)
Chloride: 108 mmol/L (ref 98–111)
Creatinine, Ser: 0.86 mg/dL (ref 0.44–1.00)
GFR, Estimated: >60 mL/min (ref >60)
Glucose, Bld: 90 mg/dL (ref 70–99)
Potassium: 4.2 mmol/L (ref 3.5–5.1)
Sodium: 140 mmol/L (ref 135–145)

## 2021-12-17 LAB — CBC
HCT: 46.4 % — ABNORMAL HIGH (ref 36.0–46.0)
HCT: 43.4 % (ref 36.0–46.0)
Hemoglobin: 14.9 g/dL (ref 12.0–15.0)
Hemoglobin: 14.5 g/dL (ref 12.0–15.0)
MCH: 30.6 pg (ref 26.0–34.0)
MCH: 31 pg (ref 26.0–34.0)
MCHC: 32.1 g/dL (ref 30.0–36.0)
MCHC: 33.4 g/dL (ref 30.0–36.0)
MCV: 95.3 fL (ref 80.0–100.0)
MCV: 92.9 fL (ref 80.0–100.0)
Platelets: 182 10*3/uL (ref 150–400)
Platelets: 187 10*3/uL (ref 150–400)
RBC: 4.87 MIL/uL (ref 3.87–5.11)
RBC: 4.67 MIL/uL (ref 3.87–5.11)
RDW: 12 % (ref 11.5–15.5)
RDW: 12.1 % (ref 11.5–15.5)
WBC: 6.4 10*3/uL (ref 4.0–10.5)
WBC: 6.5 10*3/uL (ref 4.0–10.5)
nRBC: 0 % (ref 0.0–0.2)
nRBC: 0 % (ref 0.0–0.2)

## 2021-12-17 LAB — ECHOCARDIOGRAM COMPLETE
Area-P 1/2: 4.01 cm2
Height: 67.5 in
S' Lateral: 4 cm
Weight: 4480 oz

## 2021-12-17 LAB — PHOSPHORUS: Phosphorus: 3.6 mg/dL (ref 2.5–4.6)

## 2021-12-17 LAB — MAGNESIUM: Magnesium: 2.1 mg/dL (ref 1.7–2.4)

## 2021-12-17 LAB — CREATININE, SERUM
Creatinine, Ser: 0.85 mg/dL (ref 0.44–1.00)
GFR, Estimated: >60 mL/min (ref >60)

## 2021-12-17 LAB — VITAMIN D 25 HYDROXY (VIT D DEFICIENCY, FRACTURES): Vit D, 25-Hydroxy: 33.73 ng/mL (ref 30–100)

## 2021-12-17 LAB — VITAMIN B12: Vitamin B-12: 470 pg/mL (ref 180–914)

## 2021-12-17 LAB — TSH: TSH: 3.176 u[IU]/mL (ref 0.350–4.500)

## 2021-12-17 LAB — HIV ANTIBODY (ROUTINE TESTING W REFLEX): HIV Screen 4th Generation wRfx: NONREACTIVE

## 2021-12-17 MED ORDER — HYDRALAZINE HCL 20 MG/ML IJ SOLN: 10.0000 mg | INTRAMUSCULAR | Status: DC | PRN

## 2021-12-17 MED ORDER — METOPROLOL SUCCINATE ER 25 MG PO TB24: 25.0000 mg | ORAL_TABLET | Freq: Two times a day (BID) | ORAL | 2 refills | Status: DC

## 2021-12-17 MED ORDER — THYROID 60 MG PO TABS: 120.0000 mg | ORAL_TABLET | Freq: Every day | ORAL | Status: DC

## 2021-12-17 MED ORDER — NITROGLYCERIN 0.4 MG SL SUBL: 0.4000 mg | SUBLINGUAL_TABLET | SUBLINGUAL | Status: DC | PRN

## 2021-12-17 MED ORDER — ACETAMINOPHEN 325 MG PO TABS: 650.0000 mg | ORAL_TABLET | Freq: Four times a day (QID) | ORAL | Status: DC | PRN

## 2021-12-17 MED ORDER — TRAZODONE HCL 50 MG PO TABS: 50.0000 mg | ORAL_TABLET | Freq: Every evening | ORAL | Status: DC | PRN

## 2021-12-17 MED ORDER — VITAMIN B-12 100 MCG PO TABS: 100.0000 ug | ORAL_TABLET | ORAL | Status: DC

## 2021-12-17 MED ORDER — NITROGLYCERIN 0.4 MG SL SUBL
0.4000 mg | SUBLINGUAL_TABLET | SUBLINGUAL | Status: DC | PRN
  Administered 2021-12-17: 0.4 mg via SUBLINGUAL
  Filled 2021-12-17: qty 1

## 2021-12-17 MED ORDER — SENNOSIDES-DOCUSATE SODIUM 8.6-50 MG PO TABS: 1.0000 | ORAL_TABLET | Freq: Every evening | ORAL | Status: DC | PRN

## 2021-12-17 MED ORDER — ENOXAPARIN SODIUM 40 MG/0.4ML IJ SOSY
40.0000 mg | PREFILLED_SYRINGE | INTRAMUSCULAR | Status: DC
  Administered 2021-12-17: 40 mg via SUBCUTANEOUS
  Filled 2021-12-17: qty 0.4

## 2021-12-17 MED ORDER — METOPROLOL TARTRATE 5 MG/5ML IV SOLN: 5.0000 mg | INTRAVENOUS | Status: DC | PRN

## 2021-12-17 MED ORDER — ACETAMINOPHEN 650 MG RE SUPP: 650.0000 mg | Freq: Four times a day (QID) | RECTAL | Status: DC | PRN

## 2021-12-17 MED ORDER — ONDANSETRON HCL 4 MG/2ML IJ SOLN: 4.0000 mg | Freq: Four times a day (QID) | INTRAMUSCULAR | Status: DC | PRN

## 2021-12-17 MED ORDER — IPRATROPIUM-ALBUTEROL 0.5-2.5 (3) MG/3ML IN SOLN: 3.0000 mL | RESPIRATORY_TRACT | Status: DC | PRN

## 2021-12-17 MED ORDER — METOPROLOL TARTRATE 25 MG PO TABS
50.0000 mg | ORAL_TABLET | Freq: Two times a day (BID) | ORAL | Status: DC
  Administered 2021-12-17: 50 mg via ORAL
  Filled 2021-12-17: qty 2

## 2021-12-17 MED ORDER — ONDANSETRON HCL 4 MG PO TABS: 4.0000 mg | ORAL_TABLET | Freq: Four times a day (QID) | ORAL | Status: DC | PRN

## 2021-12-17 MED ORDER — METOPROLOL SUCCINATE ER 25 MG PO TB24: 50.0000 mg | ORAL_TABLET | Freq: Every day | ORAL | Status: DC

## 2021-12-17 NOTE — Care Management Obs Status (Signed)
[place] NOTIFICATION   Patient Details  Name: Pamela Jenkins MRN: [id number] Date of Birth: [birth year]   Medicare Observation Status Notification Given:      Verbal permission to sign, does not use brialle Verdell Carmine, RN 12/17/2021, 1:55 PM

## 2021-12-17 NOTE — Care Management CC44 (Signed)
Condition Code 44 Documentation Completed  Patient Details  Name: Pamela Jenkins MRN: [id number] Date of Birth: [birth year]   Condition Code 44 given:    Patient signature on Condition Code 44 notice:    Documentation of 2 MD's agreement:    Code 44 added to claim:       Verdell Carmine, RN 12/17/2021, 1:55 PM

## 2021-12-17 NOTE — Care Management (Signed)
Discussed discharge planning, patient has no needs identified. Has house set up due to blindness. Only thing is transportation, uses [facility], will call them when she knows she is discharged and see if she can set up transport home.

## 2021-12-17 NOTE — ED Notes (Signed)
Patient awake and alert this morning, no s/s of distress noted, able to verbalize her needs, will continue to monitor.
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
## 2021-12-17 NOTE — Discharge Summary (Signed)
Physician Discharge Summary  Pamela Jenkins GHW:[id number] DOB: 1960/10/09 DOA: 12/16/2021  PCP: Leeroy Cha, MD  Admit date: 12/16/2021 Discharge date: 12/17/2021  Admitted From: Home Disposition: Home  Recommendations for Outpatient Follow-up:  Follow up with PCP in 1-2 weeks Please obtain BMP/CBC in one week your next doctors visit.  Toprol XL  increased to 50 mg twice daily Outpatient follow-up with cardiology in next 2-4 weeks   Discharge Condition: Stable CODE STATUS: Full code Diet recommendation: Heart healthy  Brief/Interim Summary:  61 year old female with past medical history of blindness, hypertension, and hypothyroidism.  Approximately 2 to 3 weeks ago she started feeling her heart flopping in her chest, it felt as though her heart was skipping beats. As it continued she started becoming lightheaded, very fatigued and nauseous each time her heart rate ramped up.  She saw her PCP who ordered a 7-day monitor and give her cards referral.  Today she developed chest pressure. She was called today after the Holter was read and told they saw SVTs, bigeminy and trigeminy.   Patient was seen by cardiologist in the hospital and echocardiogram was performed which was overall normal.  Eventually her beta-blocker was increased to (Toprol XL) 50 mg twice daily and advised to follow-up outpatient with PCP and cardiology in next 2-4 weeks.   Assessment and Plan:  Symptomatic arrhythmia/chest pressure -Monitor patient on telemetry.  Metoprolol increased to 50 mg twice daily, 2D echocardiogram shows preserved ejection fraction, TSH is normal.  Patient seen by cardiology who recommends outpatient follow-up in next 2-4 weeks and continue Toprol XL 50 mg twice daily.   Hypertension -Currently on Toprol XL 50 mg twice daily   Hypothyroidism -TSH is normal   Vitamin B12/vitamin D deficiency -Levels are normal, she can continue her home supplements      Discharge Diagnoses:   Principal Problem:   Arrhythmia Active Problems:   Chest pain    Consultations: Cardiology  Subjective: Feels great no complaints.  Chest discomfort is improved.  Discharge Exam: Vitals:   12/17/21 1200 12/17/21 1318  BP: 135/72   Pulse: (!) 57   Resp: (!) 21   Temp:  98.2 F (36.8 C)  SpO2: 97%    Vitals:   12/17/21 0830 12/17/21 0930 12/17/21 1200 12/17/21 1318  BP: 128/69 137/84 135/72   Pulse: 67 82 (!) 57   Resp: (!) 22 13 (!) 21   Temp:    98.2 F (36.8 C)  TempSrc:    Oral  SpO2: 97% 97% 97%   Weight:      Height:        General: Pt is alert, awake, not in acute distress Cardiovascular: RRR, S1/S2 +, no rubs, no gallops Respiratory: CTA bilaterally, no wheezing, no rhonchi Abdominal: Soft, NT, ND, bowel sounds + Extremities: no edema, no cyanosis  Discharge Instructions   Allergies as of 12/17/2021       Reactions   Latex Anaphylaxis   Lisinopril Cough   Naproxen Hives   Toradol [ketorolac Tromethamine] Hives   Synthroid [levothyroxine] Palpitations        Medication List     TAKE these medications    cholecalciferol 25 MCG (1000 UNIT) tablet Commonly known as: VITAMIN D3 Take 1,000 Units by mouth at bedtime.   metoprolol succinate 25 MG 24 hr tablet Commonly known as: TOPROL-XL Take 1 tablet (25 mg total) by mouth in the morning and at bedtime.   PROBIOTIC PO Take 1 capsule by mouth at bedtime.   thyroid  60 MG tablet Commonly known as: ARMOUR Take 120 mg by mouth daily before breakfast.   vitamin B-12 100 MCG tablet Commonly known as: CYANOCOBALAMIN Take 100 mcg by mouth 2 (two) times a week.        Follow-up Information     Darreld Mclean, PA-C Follow up on 01/04/2022.   Specialties: Physician Assistant, Cardiology Why: Cardiology Hospital Follow-up on 01/04/2022 at 10:55 AM with Sande Rives, PA (works with Dr. Stanford Breed) Contact information: [address] [place] [address] [phone]          Leeroy Cha, MD Follow up in 1 week(s).   Specialty: Internal Medicine Contact information: [address] [place] [address] [phone]                Allergies  Allergen Reactions   Latex Anaphylaxis   Lisinopril Cough   Naproxen Hives   Toradol [Ketorolac Tromethamine] Hives   Synthroid [Levothyroxine] Palpitations    You were cared for by a hospitalist during your hospital stay. If you have any questions about your discharge medications or the care you received while you were in the hospital after you are discharged, you can call the unit and asked to speak with the hospitalist on call if the hospitalist that took care of you is not available. Once you are discharged, your primary care physician will handle any further medical issues. Please note that no refills for any discharge medications will be authorized once you are discharged, as it is imperative that you return to your primary care physician (or establish a relationship with a primary care physician if you do not have one) for your aftercare needs so that they can reassess your need for medications and monitor your lab values.   Procedures/Studies: ECHOCARDIOGRAM COMPLETE  Result Date: 12/17/2021    ECHOCARDIOGRAM REPORT   Patient Name:   Pamela Jenkins Date of Exam: 12/17/2021 Medical Rec #:  [id number]         Height:       67.5 in Accession #:    [id number]        Weight:       280.0 lb Date of Birth:  1960/09/08          BSA:          2.346 m Patient Age:    61 years          BP:           137/84 mmHg Patient Gender: F                 HR:           61 bpm. Exam Location:  Inpatient Procedure: 2D Echo, Color Doppler and Cardiac Doppler Indications:    R07.9* Chest pain, unspecified  History:        Patient has no prior history of Echocardiogram examinations.                 Risk Factors:Hypertension.  Sonographer:    Raquel Sarna Senior RDCS Referring Phys: Quintella Baton  Sonographer  Comments: Technically difficult due to body habitus. IMPRESSIONS  1. Left ventricular ejection fraction, by estimation, is 50 to 55%. The left ventricle has low normal function. The left ventricle has no regional wall motion abnormalities. The left ventricular internal cavity size was mildly dilated. Left ventricular diastolic parameters were normal.  2. Right ventricular systolic function is normal. The right ventricular size is mildly enlarged. Tricuspid regurgitation  signal is inadequate for assessing PA pressure.  3. The mitral valve is normal in structure. Mild mitral valve regurgitation. No evidence of mitral stenosis.  4. The aortic valve is tricuspid. Aortic valve regurgitation is not visualized. Aortic valve sclerosis/calcification is present, without any evidence of aortic stenosis.  5. Aortic dilatation noted. There is mild dilatation of the ascending aorta, measuring 38 mm.  6. The inferior vena cava is normal in size with greater than 50% respiratory variability, suggesting right atrial pressure of 3 mmHg. FINDINGS  Left Ventricle: Left ventricular ejection fraction, by estimation, is 50 to 55%. The left ventricle has low normal function. The left ventricle has no regional wall motion abnormalities. The left ventricular internal cavity size was mildly dilated. There is no left ventricular hypertrophy. Left ventricular diastolic parameters were normal. Normal left ventricular filling pressure. Right Ventricle: The right ventricular size is mildly enlarged. No increase in right ventricular wall thickness. Right ventricular systolic function is normal. Tricuspid regurgitation signal is inadequate for assessing PA pressure. Left Atrium: Left atrial size was normal in size. Right Atrium: Right atrial size was normal in size. Pericardium: There is no evidence of pericardial effusion. Mitral Valve: The mitral valve is normal in structure. Mild mitral valve regurgitation. No evidence of mitral valve stenosis.  Tricuspid Valve: The tricuspid valve is normal in structure. Tricuspid valve regurgitation is trivial. No evidence of tricuspid stenosis. Aortic Valve: The aortic valve is tricuspid. Aortic valve regurgitation is not visualized. Aortic valve sclerosis/calcification is present, without any evidence of aortic stenosis. Pulmonic Valve: The pulmonic valve was normal in structure. Pulmonic valve regurgitation is trivial. No evidence of pulmonic stenosis. Aorta: Aortic dilatation noted. There is mild dilatation of the ascending aorta, measuring 38 mm. Venous: The inferior vena cava is normal in size with greater than 50% respiratory variability, suggesting right atrial pressure of 3 mmHg. IAS/Shunts: No atrial level shunt detected by color flow Doppler.  LEFT VENTRICLE PLAX 2D LVIDd:         5.40 cm   Diastology LVIDs:         4.00 cm   LV e' medial:    7.94 cm/s LV PW:         0.80 cm   LV E/e' medial:  8.0 LV IVS:        0.70 cm   LV e' lateral:   7.72 cm/s LVOT diam:     2.10 cm   LV E/e' lateral: 8.2 LV SV:         61 LV SV Index:   26 LVOT Area:     3.46 cm  RIGHT VENTRICLE RV S prime:     10.30 cm/s TAPSE (M-mode): 1.9 cm LEFT ATRIUM             Index        RIGHT ATRIUM           Index LA diam:        3.50 cm 1.49 cm/m   RA Area:     18.90 cm LA Vol (A2C):   86.9 ml 37.04 ml/m  RA Volume:   55.30 ml  23.57 ml/m LA Vol (A4C):   63.1 ml 26.90 ml/m LA Biplane Vol: 75.4 ml 32.14 ml/m  AORTIC VALVE LVOT Vmax:   71.50 cm/s LVOT Vmean:  57.300 cm/s LVOT VTI:    0.175 m  AORTA Ao Root diam: 3.30 cm Ao Asc diam:  3.80 cm MITRAL VALVE MV Area (PHT): 4.01 cm  SHUNTS MV Decel Time: 189 msec    Systemic VTI:  0.18 m MV E velocity: 63.20 cm/s  Systemic Diam: 2.10 cm MV A velocity: 52.50 cm/s MV E/A ratio:  1.20 Fransico Him MD Electronically signed by Fransico Him MD Signature Date/Time: 12/17/2021/12:41:41 PM    Final    DG Chest 2 View  Result Date: 12/16/2021 CLINICAL DATA:  Chest pain EXAM: CHEST - 2 VIEW  COMPARISON:  None Available. FINDINGS: The heart size and mediastinal contours are within normal limits. Both lungs are clear. The visualized skeletal structures are unremarkable. Postsurgical changes at the thoracic inlet. IMPRESSION: No active cardiopulmonary disease. Electronically Signed   By: Donavan Foil M.D.   On: 12/16/2021 18:16     The results of significant diagnostics from this hospitalization (including imaging, microbiology, ancillary and laboratory) are listed below for reference.     Microbiology: No results found for this or any previous visit (from the past 240 hour(s)).   Labs: BNP (last 3 results) No results for input(s): "BNP" in the last 8760 hours. Basic Metabolic Panel: Recent Labs  Lab 12/16/21 1733 12/17/21 0242 12/17/21 0517  NA 139  --  140  K 4.4  --  4.2  CL 106  --  108  CO2 25  --  21*  GLUCOSE 98  --  90  BUN 10  --  10  CREATININE 1.01* 0.85 0.86  CALCIUM 9.1  --  8.9  MG  --  2.1  --   PHOS  --  3.6  --    Liver Function Tests: Recent Labs  Lab 12/16/21 1733  AST 25  ALT 19  ALKPHOS 73  BILITOT 0.8  PROT 7.0  ALBUMIN 3.6   No results for input(s): "LIPASE", "AMYLASE" in the last 168 hours. No results for input(s): "AMMONIA" in the last 168 hours. CBC: Recent Labs  Lab 12/16/21 1733 12/17/21 0242 12/17/21 0517  WBC 6.5 6.4 6.5  NEUTROABS 3.6  --   --   HGB 15.0 14.9 14.5  HCT 44.3 46.4* 43.4  MCV 92.3 95.3 92.9  PLT 202 182 187   Cardiac Enzymes: No results for input(s): "CKTOTAL", "CKMB", "CKMBINDEX", "TROPONINI" in the last 168 hours. BNP: Invalid input(s): "POCBNP" CBG: No results for input(s): "GLUCAP" in the last 168 hours. D-Dimer No results for input(s): "DDIMER" in the last 72 hours. Hgb A1c No results for input(s): "HGBA1C" in the last 72 hours. Lipid Profile No results for input(s): "CHOL", "HDL", "LDLCALC", "TRIG", "CHOLHDL", "LDLDIRECT" in the last 72 hours. Thyroid function studies Recent Labs     12/17/21 0242  TSH 3.176   Anemia work up Recent Labs    12/17/21 1111  VITAMINB12 470   Urinalysis No results found for: "COLORURINE", "APPEARANCEUR", "LABSPEC", "PHURINE", "GLUCOSEU", "HGBUR", "BILIRUBINUR", "KETONESUR", "PROTEINUR", "UROBILINOGEN", "NITRITE", "LEUKOCYTESUR" Sepsis Labs Recent Labs  Lab 12/16/21 1733 12/17/21 0242 12/17/21 0517  WBC 6.5 6.4 6.5   Microbiology No results found for this or any previous visit (from the past 240 hour(s)).   Time coordinating discharge:  I have spent 35 minutes face to face with the patient and on the ward discussing the patients care, assessment, plan and disposition with other care givers. >50% of the time was devoted counseling the patient about the risks and benefits of treatment/Discharge disposition and coordinating care.   SIGNED:   Damita Lack, MD  [facility] 12/17/2021, 2:07 PM   If 7PM-7AM, please contact night-coverage

## 2021-12-17 NOTE — Progress Notes (Signed)
Echocardiogram 2D Echocardiogram has been performed.  Oneal Deputy Cherrish Vitali RDCS 12/17/2021, 12:34 PM

## 2021-12-17 NOTE — H&P (Signed)
PCP:   Leeroy Cha, MD   Chief Complaint: Chest pains  HPI: This is a 61 year old female with past medical history of blindness, hypertension, and hypothyroidism.  Approximately 2 to 3 weeks ago she started feeling her heart flopping in her chest, it felt as though her heart was skipping beats.  She had this previously with her thyroid issue, she thought it would resolve as it did then, however, the palpitations did not go away.  As it continued she started becoming lightheaded, very fatigued and nauseous each time her heart rate ramped up.  She saw her PCP who ordered a 7-day monitor and give her cards referral.  Today she developed chest pressure.  Per patient her chest pressure is continuous, it ramps up with each episode of palpitation.  Her chest pain is centrally located, slightly to the left.  She denies radiation.  At its worst the pressure/pain is 9/10, currently 5-6/10.  She was called today after the Holter was read and told they saw SVTs, bigeminy and trigeminy.  She does not recall being told how fast her SVTs were.  She told them she was having chest pains, they directed her to go to the ER.  During the interview patient's chest pain was 5-6/10.  She received sublingual nitro x1.  Her chest pain resolved.  Review of Systems:  The patient denies anorexia, fever, weight loss, decreased hearing, hoarseness, syncope, dyspnea on exertion, peripheral edema, balance deficits, hemoptysis, abdominal pain, melena, hematochezia, severe indigestion/heartburn, hematuria, incontinence, genital sores, muscle weakness, suspicious skin lesions, difficulty walking, depression, unusual weight change, abnormal bleeding, enlarged lymph nodes, angioedema, and breast masses. Positives: Chest pain, lightheadedness, dizziness, nausea, headache, blindness  Past Medical History: Past Medical History:  Diagnosis Date   Hypertension    Osteoarthritis    Thyroid disease    Past Surgical History:   Procedure Laterality Date   ABDOMINAL HYSTERECTOMY     APPENDECTOMY     CYST REMOVAL HAND     SKIN GRAFT     THYROIDECTOMY, PARTIAL      Medications: Prior to Admission medications   Medication Sig Start Date End Date Taking? Authorizing Provider  cholecalciferol (VITAMIN D3) 25 MCG (1000 UNIT) tablet Take 1,000 Units by mouth at bedtime.   Yes [provider]  metoprolol succinate (TOPROL-XL) 25 MG 24 hr tablet Take 25 mg by mouth in the morning and at bedtime.   Yes [provider]  Probiotic Product (PROBIOTIC PO) Take 1 capsule by mouth at bedtime.   Yes [provider]  thyroid (ARMOUR) 60 MG tablet Take 120 mg by mouth daily before breakfast.   Yes [provider]  vitamin B-12 (CYANOCOBALAMIN) 100 MCG tablet Take 100 mcg by mouth 2 (two) times a week.   Yes [provider]    Allergies:   Allergies  Allergen Reactions   Latex Anaphylaxis   Lisinopril Cough   Naproxen Hives   Toradol [Ketorolac Tromethamine] Hives   Synthroid [Levothyroxine] Palpitations    Social History:  has no history on file for tobacco use, alcohol use, and drug use.  Family History: Family History  Problem Relation Age of Onset   Breast cancer Neg Hx     Physical Exam: Vitals:   12/16/21 1946 12/16/21 2101 12/16/21 2215 12/16/21 2300  BP: (!) 154/101 (!) 156/76 (!) 165/72 135/62  Pulse: (!) 25 67 77 70  Resp: (!) '8 12 17 15  '$ Temp: 98.3 F (36.8 C)     TempSrc:  SpO2: 99% 98% 100% 95%  Weight:      Height:        General:  Alert and oriented times three, obese, no acute distress Eyes: Blind, pink conjunctiva, no scleral icterus ENT: Moist oral mucosa, neck supple, no thyromegaly Lungs: clear to ascultation, no wheeze, no crackles, no use of accessory muscles Cardiovascular: irregular rate and rhythm, no regurgitation, no gallops, no murmurs. No carotid bruits, no JVD Abdomen: soft, positive BS, non-tender, non-distended, no  organomegaly, not an acute abdomen GU: not examined Neuro: CN II - XII grossly intact, sensation intact Musculoskeletal: strength 5/5 all extremities, no clubbing, cyanosis or edema Skin: no rash, no subcutaneous crepitation, no decubitus Psych: appropriate patient   Labs on Admission:  Recent Labs    12/16/21 1733  NA 139  K 4.4  CL 106  CO2 25  GLUCOSE 98  BUN 10  CREATININE 1.01*  CALCIUM 9.1   Recent Labs    12/16/21 1733  AST 25  ALT 19  ALKPHOS 73  BILITOT 0.8  PROT 7.0  ALBUMIN 3.6   No results for input(s): "LIPASE", "AMYLASE" in the last 72 hours. Recent Labs    12/16/21 1733  WBC 6.5  NEUTROABS 3.6  HGB 15.0  HCT 44.3  MCV 92.3  PLT 202     Radiological Exams on Admission: DG Chest 2 View  Result Date: 12/16/2021 CLINICAL DATA:  Chest pain EXAM: CHEST - 2 VIEW COMPARISON:  None Available. FINDINGS: The heart size and mediastinal contours are within normal limits. Both lungs are clear. The visualized skeletal structures are unremarkable. Postsurgical changes at the thoracic inlet. IMPRESSION: No active cardiopulmonary disease. Electronically Signed   By: Donavan Foil M.D.   On: 12/16/2021 18:16    Assessment/Plan Present on Admission: Symptomatic arrhythmia/chest pressure -Admit to med telemetry -Patient on metoprolol 25 mg twice daily for hypertension, increased to 50 mg p.o. twice daily -TSH ordered -2D echo -Cardiology consult -Patient made n.p.o.  Hypertension -Metoprolol continued at 50 mg p.o. twice daily  Hypothyroidism -Armour held until TSH resulted  Vitamin B12/vitamin D deficiency -   Hutton Pellicane 12/17/2021, 1:51 AM

## 2021-12-18 ENCOUNTER — Other Ambulatory Visit: Payer: Self-pay | Admitting: Student

## 2021-12-18 MED ORDER — METOPROLOL SUCCINATE ER 50 MG PO TB24: 50.0000 mg | ORAL_TABLET | Freq: Two times a day (BID) | ORAL | 5 refills | Status: DC

## 2021-12-19 DIAGNOSIS — E039 Hypothyroidism, unspecified: Secondary | ICD-10-CM | POA: Diagnosis not present

## 2021-12-19 DIAGNOSIS — I10 Essential (primary) hypertension: Secondary | ICD-10-CM | POA: Diagnosis not present

## 2021-12-19 DIAGNOSIS — I471 Supraventricular tachycardia, unspecified: Secondary | ICD-10-CM | POA: Diagnosis not present

## 2021-12-19 DIAGNOSIS — R002 Palpitations: Secondary | ICD-10-CM | POA: Diagnosis not present

## 2021-12-29 NOTE — Progress Notes (Deleted)
Cardiology Office Note:    Date:  12/29/2021   ID:  Pamela Jenkins, DOB 12/08/1960, MRN [id number]  PCP:  Leeroy Cha, MD  Cardiologist:  None  Electrophysiologist:  None   Referring MD: Leeroy Cha,*   Chief Complaint: hospital follow-up and atypical chest pain  History of Present Illness:    Pamela Jenkins is a 61 y.o. female with a history of palpitations with SVT and frequent PVCs noted on recent monitor, hypertension, hypothyroidism, osteoarthritis, and legal blindness who is followed by Dr. Stanford Breed and presents today for hospital follow-up of palpitations and atypical chest pain.  Patient was first seen by Dr. Stanford Breed during recent admission. She was admitted from 12/16/2021 to 12/17/2021 for further evaluation of palpitations and chest pain. PCP had recently ordered a Zio monitor which showed 12 runs of SVT with the fastest interval lasting 10 beats and the longest lasting 13.5 seconds as well as frequent PVCs with an overall burden of 14.9%. Her PCP had referred her to Cardiology but she had not been seen yet. She came to the ED because she started having some chest pressure which seemed to be associated with palpitations. EKG showed no acute findings and high-sensitivity troponin was negative. Electrolytes and TSH were normal. Echo showed LVEF of 50-55% with normal wall motion and diastolic parameters, mildly enlarged RV with normal systolic function, mild MR, and mild dilation of the ascending aorta measuring 38 mm. Palpitations were felt ot be due to PVCs and short episodes of SVT/ PAT and chest pain was felt to be secondary to palpitations. Toprol-XL was increased with plans to refer to EP for possible ablation if symptoms persist.  Patient presents today for follow-up. ***  Palpitations Paroxysmal SVT and Frequent PVCs Patient was recently admitted with palpitations with associated chest pain. Recent outpatient monitor that PCP ordered showed 12  runs of SVT with the fastest interval lasting 10 beats and the longest lasting 13.5 seconds as well as frequent PVCs with an overall burden of 14.9%. Echo during admission showed normal LV function. Beta-blocker was increased.  - *** - Continue Toprol-XL '50mg'$  daily.  - If symptoms persist, Dr. Stanford Breed recommended referring to EP for consideration of ablation.   Atypical Chest Pain Recently admitted with atypical chest pain that seemed to be related to palpitations. EKG and high-sensitivity troponin negative. Echo showed normal LV function and wall motion. - ***  Hypertension BP *** - Continue Toprol-XL '50mg'$  daily.   Past Medical History:  Diagnosis Date   Hypertension    Osteoarthritis    Thyroid disease     Past Surgical History:  Procedure Laterality Date   ABDOMINAL HYSTERECTOMY     APPENDECTOMY     CYST REMOVAL HAND     SKIN GRAFT     THYROIDECTOMY, PARTIAL      Current Medications: No outpatient medications have been marked as taking for the 01/04/22 encounter (Appointment) with Darreld Mclean, PA-C.     Allergies:   Latex, Lisinopril, Naproxen, Toradol [ketorolac tromethamine], and Synthroid [levothyroxine]   Social History   Socioeconomic History   Marital status: Single    Spouse name: Not on file   Number of children: Not on file   Years of education: Not on file   Highest education level: Not on file  Occupational History   Not on file  Tobacco Use   Smoking status: Unknown   Smokeless tobacco: Not on file  Substance and Sexual Activity   Alcohol use: Not on file  Drug use: Not on file   Sexual activity: Not on file  Other Topics Concern   Not on file  Social History Narrative   Not on file   Social Determinants of Health   Financial Resource Strain: Not on file  Food Insecurity: Not on file  Transportation Needs: Not on file  Physical Activity: Not on file  Stress: Not on file  Social Connections: Not on file     Family History: The  patient's family history includes Heart attack in her father. There is no history of Breast cancer.  ROS:   Please see the history of present illness.     EKGs/Labs/Other Studies Reviewed:    The following studies were reviewed:  Echocardiogram 12/17/2021: Impressions: 1. Left ventricular ejection fraction, by estimation, is 50 to 55%. The  left ventricle has low normal function. The left ventricle has no regional  wall motion abnormalities. The left ventricular internal cavity size was  mildly dilated. Left ventricular  diastolic parameters were normal.   2. Right ventricular systolic function is normal. The right ventricular  size is mildly enlarged. Tricuspid regurgitation signal is inadequate for  assessing PA pressure.   3. The mitral valve is normal in structure. Mild mitral valve  regurgitation. No evidence of mitral stenosis.   4. The aortic valve is tricuspid. Aortic valve regurgitation is not  visualized. Aortic valve sclerosis/calcification is present, without any  evidence of aortic stenosis.   5. Aortic dilatation noted. There is mild dilatation of the ascending  aorta, measuring 38 mm.   6. The inferior vena cava is normal in size with greater than 50%  respiratory variability, suggesting right atrial pressure of 3 mmHg.    EKG:  EKG not ordered today.   Recent Labs: 12/16/2021: ALT 19 12/17/2021: BUN 10; Creatinine, Ser 0.86; Hemoglobin 14.5; Magnesium 2.1; Platelets 187; Potassium 4.2; Sodium 140; TSH 3.176  Recent Lipid Panel No results found for: "CHOL", "TRIG", "HDL", "CHOLHDL", "VLDL", "LDLCALC", "LDLDIRECT"  Physical Exam:    Vital Signs: There were no vitals taken for this visit.    Wt Readings from Last 3 Encounters:  12/16/21 280 lb (127 kg)     General: 61 y.o. female in no acute distress. HEENT: Normocephalic and atraumatic. Sclera clear. EOMs intact. Neck: Supple. No carotid bruits. No JVD. Heart: *** RRR. Distinct S1 and S2. No murmurs,  gallops, or rubs. Radial and distal pedal pulses 2+ and equal bilaterally. Lungs: No increased work of breathing. Clear to ausculation bilaterally. No wheezes, rhonchi, or rales.  Abdomen: Soft, non-distended, and non-tender to palpation. Bowel sounds present in all 4 quadrants.  MSK: Normal strength and tone for age. *** Extremities: No lower extremity edema.    Skin: Warm and dry. Neuro: Alert and oriented x3. No focal deficits. Psych: Normal affect. Responds appropriately.   Assessment:    No diagnosis found.  Plan:     Disposition: Follow up in ***   Medication Adjustments/Labs and Tests Ordered: Current medicines are reviewed at length with the patient today.  Concerns regarding medicines are outlined above.  No orders of the defined types were placed in this encounter.  No orders of the defined types were placed in this encounter.   There are no Patient Instructions on file for this visit.   Signed, Darreld Mclean, PA-C  12/29/2021 8:55 PM    [place]
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
## 2022-01-04 ENCOUNTER — Encounter: Payer: Self-pay | Admitting: Student

## 2022-01-04 ENCOUNTER — Ambulatory Visit: Payer: Medicare Other | Attending: Student | Admitting: Student

## 2022-01-04 VITALS — BP 130/70 | HR 74 | Ht 67.0 in | Wt 287.6 lb

## 2022-01-04 DIAGNOSIS — R002 Palpitations: Secondary | ICD-10-CM | POA: Diagnosis not present

## 2022-01-04 DIAGNOSIS — R0789 Other chest pain: Secondary | ICD-10-CM | POA: Diagnosis not present

## 2022-01-04 DIAGNOSIS — R0602 Shortness of breath: Secondary | ICD-10-CM

## 2022-01-04 DIAGNOSIS — I10 Essential (primary) hypertension: Secondary | ICD-10-CM | POA: Diagnosis not present

## 2022-01-04 MED ORDER — DILTIAZEM HCL ER 120 MG PO TB24: 120.0000 mg | ORAL_TABLET | Freq: Every day | ORAL | 2 refills | Status: DC

## 2022-01-04 NOTE — Patient Instructions (Signed)
Medication Instructions:  Start Cardizem 120 mg (Take 1 Tablet Daily) *If you need a refill on your cardiac medications before your next appointment, please call your pharmacy*   Lab Work: No Labs If you have labs (blood work) drawn today and your tests are completely normal, you will receive your results only by: [place] (if you have MyChart) OR A paper copy in the mail If you have any lab test that is abnormal or we need to change your treatment, we will call you to review the results.   Testing/Procedures: No Testing    Follow-Up: At [facility], you and your health needs are our priority.  As part of our continuing mission to provide you with exceptional heart care, we have created designated Provider Care Teams.  These Care Teams include your primary Cardiologist (physician) and Advanced Practice Providers (APPs -  Physician Assistants and Nurse Practitioners) who all work together to provide you with the care you need, when you need it.  We recommend signing up for the patient portal called "MyChart".  Sign up information is provided on this After Visit Summary.  MyChart is used to connect with patients for Virtual Visits (Telemedicine).  Patients are able to view lab/test results, encounter notes, upcoming appointments, etc.  Non-urgent messages can be sent to your provider as well.   To learn more about what you can do with MyChart, go to [email or web address].    Your next appointment:   6 month(s)  The format for your next appointment:   In Person  Provider:   Kirk Ruths, MD

## 2022-01-05 ENCOUNTER — Telehealth: Payer: Self-pay | Admitting: Student

## 2022-01-05 MED ORDER — DILTIAZEM HCL ER COATED BEADS 120 MG PO CP24: 120.0000 mg | ORAL_CAPSULE | Freq: Every day | ORAL | 3 refills | Status: DC

## 2022-01-05 NOTE — Telephone Encounter (Signed)
Pt c/o medication issue:  1. Name of Medication: diltiazem (CARDIZEM LA) 120 MG 24 hr tablet   2. How are you currently taking this medication (dosage and times per day)?   Take 1 tablet (120 mg total) by mouth daily.    3. Are you having a reaction (difficulty breathing--STAT)?   4. What is your medication issue? Patient states she was started on this medication yesterday, however medicare won't cover this medication, it will cost her over $200.  This needs to be generic brand, that will be on the $4 menu and it needs to be sent to the [facility] on [address].

## 2022-01-05 NOTE — Telephone Encounter (Signed)
Diltiazem '120mg'$  QD ER is $9/month Patient is good with this price.

## 2022-01-05 NOTE — Telephone Encounter (Signed)
Rx for diltiazem '120mg'$  QD sent to [facility] per request

## 2022-01-27 ENCOUNTER — Encounter: Payer: Self-pay | Admitting: Internal Medicine

## 2022-01-27 ENCOUNTER — Ambulatory Visit: Payer: Medicare Other | Attending: Internal Medicine | Admitting: Internal Medicine

## 2022-01-27 VITALS — BP 130/84 | HR 74 | Ht 67.0 in | Wt 288.0 lb

## 2022-01-27 DIAGNOSIS — R002 Palpitations: Secondary | ICD-10-CM | POA: Insufficient documentation

## 2022-01-27 MED ORDER — FLECAINIDE ACETATE 50 MG PO TABS: 50.0000 mg | ORAL_TABLET | Freq: Two times a day (BID) | ORAL | 3 refills | Status: DC

## 2022-01-27 NOTE — Patient Instructions (Signed)
[email or web address]  Medication Instructions:  Your physician has recommended you make the following change in your medication:  1) STOP taking metoprolol  2) START taking flecainide 50 mg twice daily  *If you need a refill on your cardiac medications before your next appointment, please call your pharmacy*  Follow-Up: At [facility], you and your health needs are our priority.  As part of our continuing mission to provide you with exceptional heart care, we have created designated Provider Care Teams.  These Care Teams include your primary Cardiologist (physician) and Advanced Practice Providers (APPs -  Physician Assistants and Nurse Practitioners) who all work together to provide you with the care you need, when you need it.  Your next appointment:   4 week(s)  The format for your next appointment:   Virtual Visit   Provider:   Virl Axe, MD     Important Information About Sugar
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
## 2022-02-02 ENCOUNTER — Encounter: Payer: Self-pay | Admitting: Internal Medicine

## 2022-02-02 DIAGNOSIS — R072 Precordial pain: Secondary | ICD-10-CM

## 2022-02-02 MED ORDER — METOPROLOL TARTRATE 100 MG PO TABS: ORAL_TABLET | ORAL | 0 refills | Status: DC

## 2022-02-06 ENCOUNTER — Telehealth (HOSPITAL_COMMUNITY): Payer: Self-pay | Admitting: Emergency Medicine

## 2022-02-06 NOTE — Telephone Encounter (Signed)
Reaching out to patient to offer assistance regarding upcoming cardiac imaging study; pt verbalizes understanding of appt date/time, parking situation and where to check in, pre-test NPO status and medications ordered, and verified current allergies; name and call back number provided for further questions should they arise Pamela Bond RN [facility] [phone] office [phone] cell  Arrival 1100 WC entrance Patient is blind Daily meds, '100mg'$  metoprolol tartrate 2 hr prior to scan Denies iv issues Aware contrast/nitro

## 2022-02-07 ENCOUNTER — Ambulatory Visit (HOSPITAL_COMMUNITY)
Admission: RE | Admit: 2022-02-07 | Discharge: 2022-02-07 | Disposition: A | Discharge status: Home / Self Care | Payer: Medicare Other | Source: Ambulatory Visit | Attending: Internal Medicine | Admitting: Internal Medicine

## 2022-02-07 DIAGNOSIS — R072 Precordial pain: Secondary | ICD-10-CM

## 2022-02-07 MED ORDER — NITROGLYCERIN 0.4 MG SL SUBL
0.8000 mg | SUBLINGUAL_TABLET | Freq: Once | SUBLINGUAL | Status: AC
  Administered 2022-02-07: 0.8 mg via SUBLINGUAL

## 2022-02-07 MED ORDER — IOHEXOL 350 MG/ML SOLN
100.0000 mL | Freq: Once | INTRAVENOUS | Status: AC | PRN
  Administered 2022-02-07: 100 mL via INTRAVENOUS

## 2022-02-07 MED ORDER — NITROGLYCERIN 0.4 MG SL SUBL
SUBLINGUAL_TABLET | SUBLINGUAL | Status: AC
  Filled 2022-02-07: qty 2

## 2022-02-21 ENCOUNTER — Ambulatory Visit: Payer: Medicare Other | Attending: Internal Medicine | Admitting: Internal Medicine

## 2022-02-21 ENCOUNTER — Encounter: Payer: Self-pay | Admitting: Internal Medicine

## 2022-02-21 VITALS — BP 148/79 | HR 77 | Ht 67.0 in

## 2022-02-21 DIAGNOSIS — I471 Supraventricular tachycardia, unspecified: Secondary | ICD-10-CM

## 2022-02-21 DIAGNOSIS — R002 Palpitations: Secondary | ICD-10-CM

## 2022-02-21 MED ORDER — FLECAINIDE ACETATE 50 MG PO TABS: ORAL_TABLET | ORAL | 3 refills | Status: DC

## 2022-02-21 NOTE — Patient Instructions (Signed)
Medication Instructions:  ** Decrease Flecainide '50mg'$  to '25mg'$  (1/2 tablet by mouth twice daily)  If you continue to have palpitations you may increase to three times daily. *If you need a refill on your cardiac medications before your next appointment, please call your pharmacy*   Lab Work: None ordered.  If you have labs (blood work) drawn today and your tests are completely normal, you will receive your results only by: [place] (if you have MyChart) OR A paper copy in the mail If you have any lab test that is abnormal or we need to change your treatment, we will call you to review the results.   Testing/Procedures: None ordered.    Follow-Up: At [facility], you and your health needs are our priority.  As part of our continuing mission to provide you with exceptional heart care, we have created designated Provider Care Teams.  These Care Teams include your primary Cardiologist (physician) and Advanced Practice Providers (APPs -  Physician Assistants and Nurse Practitioners) who all work together to provide you with the care you need, when you need it.  We recommend signing up for the patient portal called "MyChart".  Sign up information is provided on this After Visit Summary.  MyChart is used to connect with patients for Virtual Visits (Telemedicine).  Patients are able to view lab/test results, encounter notes, upcoming appointments, etc.  Non-urgent messages can be sent to your provider as well.   To learn more about what you can do with MyChart, go to [email or web address].    Your next appointment:   4 week telephone visit - Will have Dr Olin Pia scheduler call you.
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
## 2022-02-22 ENCOUNTER — Other Ambulatory Visit: Payer: Self-pay | Admitting: Internal Medicine

## 2022-02-22 DIAGNOSIS — Z1231 Encounter for screening mammogram for malignant neoplasm of breast: Secondary | ICD-10-CM

## 2022-02-28 DIAGNOSIS — E538 Deficiency of other specified B group vitamins: Secondary | ICD-10-CM | POA: Diagnosis not present

## 2022-02-28 DIAGNOSIS — E039 Hypothyroidism, unspecified: Secondary | ICD-10-CM | POA: Diagnosis not present

## 2022-02-28 DIAGNOSIS — N200 Calculus of kidney: Secondary | ICD-10-CM | POA: Diagnosis not present

## 2022-02-28 DIAGNOSIS — E559 Vitamin D deficiency, unspecified: Secondary | ICD-10-CM | POA: Diagnosis not present

## 2022-02-28 DIAGNOSIS — I10 Essential (primary) hypertension: Secondary | ICD-10-CM | POA: Diagnosis not present

## 2022-03-20 ENCOUNTER — Ambulatory Visit: Payer: Medicare Other | Attending: Internal Medicine | Admitting: Internal Medicine

## 2022-03-20 ENCOUNTER — Telehealth: Payer: Self-pay

## 2022-03-20 ENCOUNTER — Encounter: Payer: Self-pay | Admitting: Internal Medicine

## 2022-03-20 VITALS — BP 133/76 | HR 80 | Ht 67.0 in

## 2022-03-20 DIAGNOSIS — I471 Supraventricular tachycardia, unspecified: Secondary | ICD-10-CM | POA: Diagnosis not present

## 2022-03-20 DIAGNOSIS — R002 Palpitations: Secondary | ICD-10-CM | POA: Diagnosis not present

## 2022-03-20 NOTE — Patient Instructions (Addendum)
Medication Instructions:  Your physician has recommended you make the following change in your medication:   ** You may try increasing your Flecainide 27m to every 8 hours.  *If you need a refill on your cardiac medications before your next appointment, please call your pharmacy*   Lab Work: None ordered.  If you have labs (blood work) drawn today and your tests are completely normal, you will receive your results only by: [facility](if you have MyChart) OR A paper copy in the mail If you have any lab test that is abnormal or we need to change your treatment, we will call you to review the results.   Testing/Procedures: None ordered.    Follow-Up: [facility] and your [facility] needs are our priority.  As part of our continuing mission to provide you with exceptional heart care, we have created designated Provider Care Teams.  These Care Teams include your primary Cardiologist (physician) and Advanced Practice Providers (APPs -  Physician Assistants and Nurse Practitioners) who all work together to provide you with the care you need, when you need it.  We recommend signing up for the patient portal called "MyChart".  Sign up information is provided on this After Visit Summary.  MyChart is used to connect with patients for Virtual Visits (Telemedicine).  Patients are able to view lab/test results, encounter notes, upcoming appointments, etc.  Non-urgent messages can be sent to your provider as well.   To learn more about what you can do with MyChart, go to [email or web address]    Your next appointment:   6-8 week telephone visit with Dr KCaryl Comes  I will have his scheduler contact you to schedule this appointment.

## 2022-03-20 NOTE — Progress Notes (Signed)
Electrophysiology TeleHealth Note      Date:  03/20/2022   ID:  Pamela Jenkins, DOB Apr 11, 1960, MRN [id number]  Location: patient's home  Provider location: [address] [place]  Evaluation Performed: Follow-up visit  PCP:  Leeroy Cha, MD  Cardiologist:    Electrophysiologist:  SK   Chief Complaint:  palpitations   History of Present Illness:    Pamela Jenkins is a 62 y.o. female who presents via audio/video conferencing for a telehealth visit today.  Since last being seen in our clinic for palpitations for which she was started on flecainide at 50 bid but which was subsequently complicated by wheezing (review suggest up to 10%), the patient reports doing much better than off the flecainide at 25 twice daily with an occasional extra 25 mg.  Wheezing has resolved.    The patient denies symptoms of fevers, chills, cough, or new SOB worrisome for COVID 19.   Past Medical History:  Diagnosis Date   Hypertension    Osteoarthritis    Thyroid disease     Past Surgical History:  Procedure Laterality Date   ABDOMINAL HYSTERECTOMY     APPENDECTOMY     CYST REMOVAL HAND     SKIN GRAFT     THYROIDECTOMY, PARTIAL      Current Outpatient Medications  Medication Sig Dispense Refill   Cholecalciferol (VITAMIN D) 50 MCG (2000 UT) tablet Take 2,000 Units by mouth at bedtime.     cyanocobalamin (VITAMIN B12) 1000 MCG tablet Take 100 mcg by mouth 2 (two) times a week.     diclofenac Sodium (VOLTAREN) 1 % GEL      diltiazem (CARDIZEM CD) 120 MG 24 hr capsule Take 1 capsule (120 mg total) by mouth daily. 90 capsule 3   flecainide (TAMBOCOR) 50 MG tablet Take 1/2 tablet (35m) by mouth 2 times daily and may increase to 3 times daily if palpitations persist. 135 tablet 3   Homeopathic Products ([place]) SUBL Place under the tongue as needed.     meloxicam (MOBIC) 15 MG tablet Take 15 mg by mouth daily as needed for pain.     nitroGLYCERIN  (NITROSTAT) 0.4 MG SL tablet Place 0.4 mg under the tongue every 5 (five) minutes as needed for chest pain.     Probiotic Product (PROBIOTIC PO) Take 1 capsule by mouth at bedtime.     thyroid (ARMOUR) 60 MG tablet Take 120 mg by mouth daily before breakfast.     tiZANidine (ZANAFLEX) 4 MG tablet Take 4 mg by mouth as needed.     metoprolol tartrate (LOPRESSOR) 100 MG tablet Take 1 tablet 2 hours prior to your CT scan. 1 tablet 0   No current facility-administered medications for this visit.    Allergies:   Latex, Lisinopril, Naproxen, Toradol [ketorolac tromethamine], and Synthroid [levothyroxine]      Exam:    Vital Signs:  BP 133/76 Comment: patient provided  Pulse 80 Comment: patient provided  Ht 5' 7"$  (1.702 m)   BMI 45.11 kg/m       Labs/Other Tests and Data Reviewed:    Recent Labs: 12/16/2021: ALT 19 12/17/2021: BUN 10; Creatinine, Ser 0.86; Hemoglobin 14.5; Magnesium 2.1; Platelets 187; Potassium 4.2; Sodium 140; TSH 3.176   Wt Readings from Last 3 Encounters:  01/27/22 288 lb (130.6 kg)  01/04/22 287 lb 9.6 oz (130.5 kg)  12/16/21 280 lb (127 kg)     Other studies personally reviewed: Additional studies/ records that were  reviewed today include:     ASSESSMENT & PLAN:    Symptomatic palpitations-predominantly PVCs left bundle inferior axis transition V2-V3   SVT-relatively infrequent   Hypertension  Wheezing    Obesity   Venous insufficiency   Blind   Will increase flecainide to 25 every 8.  She can take an extra 25 at night.  In the event that she tolerates this we might try next to resume 50 twice daily or alternatively increase her diltiazem from 120--180  Follow-up:  6-8 weeks telehealth visit      Current medicines are reviewed at length with the patient today.   The patient  concerns regarding her medicines.  The following changes were made today:flecainide 25 q8   Labs/ tests ordered today include:  No orders of the defined types were  placed in this encounter.     Today, I have spent 11 minutes with the patient with telehealth technology discussing the above.  Signed, Virl Axe, MD  03/20/2022 5:08 PM     [place] [address] [place] [address] [phone] (office) [phone] (fax)

## 2022-03-20 NOTE — Telephone Encounter (Signed)
..   Pt understands that although there may be some limitations with this type of visit, we will take all precautions to reduce any security or privacy concerns.  Pt understands that this will be treated like an in office visit and we will file with pt's insurance, and there may be a patient responsible charge related to this service. ? ?

## 2022-04-12 ENCOUNTER — Ambulatory Visit
Admission: RE | Admit: 2022-04-12 | Discharge: 2022-04-12 | Disposition: A | Discharge status: Home / Self Care | Payer: Medicare Other | Source: Ambulatory Visit

## 2022-04-12 DIAGNOSIS — Z1231 Encounter for screening mammogram for malignant neoplasm of breast: Secondary | ICD-10-CM | POA: Diagnosis not present

## 2022-05-03 DIAGNOSIS — E039 Hypothyroidism, unspecified: Secondary | ICD-10-CM | POA: Diagnosis not present

## 2022-05-03 DIAGNOSIS — R2689 Other abnormalities of gait and mobility: Secondary | ICD-10-CM | POA: Diagnosis not present

## 2022-05-03 DIAGNOSIS — K760 Fatty (change of) liver, not elsewhere classified: Secondary | ICD-10-CM | POA: Diagnosis not present

## 2022-05-03 DIAGNOSIS — I872 Venous insufficiency (chronic) (peripheral): Secondary | ICD-10-CM | POA: Diagnosis not present

## 2022-05-03 DIAGNOSIS — M150 Primary generalized (osteo)arthritis: Secondary | ICD-10-CM | POA: Diagnosis not present

## 2022-05-03 DIAGNOSIS — Z0000 Encounter for general adult medical examination without abnormal findings: Secondary | ICD-10-CM | POA: Diagnosis not present

## 2022-05-03 DIAGNOSIS — Z90710 Acquired absence of both cervix and uterus: Secondary | ICD-10-CM | POA: Diagnosis not present

## 2022-05-03 DIAGNOSIS — F3342 Major depressive disorder, recurrent, in full remission: Secondary | ICD-10-CM | POA: Diagnosis not present

## 2022-05-03 DIAGNOSIS — K8020 Calculus of gallbladder without cholecystitis without obstruction: Secondary | ICD-10-CM | POA: Diagnosis not present

## 2022-05-03 DIAGNOSIS — E538 Deficiency of other specified B group vitamins: Secondary | ICD-10-CM | POA: Diagnosis not present

## 2022-05-03 DIAGNOSIS — H547 Unspecified visual loss: Secondary | ICD-10-CM | POA: Diagnosis not present

## 2022-05-03 DIAGNOSIS — Z1331 Encounter for screening for depression: Secondary | ICD-10-CM | POA: Diagnosis not present

## 2022-05-03 DIAGNOSIS — I10 Essential (primary) hypertension: Secondary | ICD-10-CM | POA: Diagnosis not present

## 2022-05-11 ENCOUNTER — Ambulatory Visit: Payer: Medicare Other | Attending: Cardiovascular Disease | Admitting: Internal Medicine

## 2022-05-11 ENCOUNTER — Telehealth: Payer: Self-pay

## 2022-05-11 ENCOUNTER — Encounter: Payer: Self-pay | Admitting: Internal Medicine

## 2022-05-11 VITALS — BP 124/64 | HR 82 | Ht 67.0 in

## 2022-05-11 DIAGNOSIS — R002 Palpitations: Secondary | ICD-10-CM

## 2022-05-11 NOTE — Progress Notes (Signed)
Electrophysiology TeleHealth Note      Date:  05/11/2022   ID:  Pamela [place], DOB 1960/12/15, MRN [id number]  Location: patient's home  Provider location: [address] [place]  Evaluation Performed: Follow-up visit  PCP:  Lorenda Ishihara, MD  Cardiologist:    Electrophysiologist:  SK   Chief Complaint:  palpitations   History of Present Illness:    Pamela [place] is a 62 y.o. female who presents via audio/video conferencing for a telehealth visit today.  Since last being seen in our clinic for palpitations PVCs 14.9 % for which she was started on flecainide at 50 bid but which was subsequently complicated by wheezing (review suggest up to 10%), the patient reports doing much better than off the flecainide at 25 twice daily and have increased to 25 tid Interval episode "SVT" last week >> 15-20 min Now daily  Dilt 120 daily    The patient denies symptoms of fevers, chills, cough, or new SOB worrisome for COVID 19.   Past Medical History:  Diagnosis Date   Hypertension    Osteoarthritis    Thyroid disease     Past Surgical History:  Procedure Laterality Date   ABDOMINAL HYSTERECTOMY     APPENDECTOMY     CYST REMOVAL HAND     SKIN GRAFT     THYROIDECTOMY, PARTIAL      Current Outpatient Medications  Medication Sig Dispense Refill   Cholecalciferol (VITAMIN D) 50 MCG (2000 UT) tablet Take 2,000 Units by mouth at bedtime.     cyanocobalamin (VITAMIN B12) 1000 MCG tablet Take 100 mcg by mouth 2 (two) times a week.     diclofenac Sodium (VOLTAREN) 1 % GEL      diltiazem (CARDIZEM CD) 120 MG 24 hr capsule Take 1 capsule (120 mg total) by mouth daily. 90 capsule 3   flecainide (TAMBOCOR) 50 MG tablet Take 1/2 tablet (25mg ) by mouth 2 times daily and may increase to 3 times daily if palpitations persist. (Patient taking differently: Take 1/2 tablet (25mg ) by mouth 3 times a day) 135 tablet 3   Homeopathic Products ([place]) SUBL Place  under the tongue as needed.     meloxicam (MOBIC) 15 MG tablet Take 15 mg by mouth daily as needed for pain.     nitroGLYCERIN (NITROSTAT) 0.4 MG SL tablet Place 0.4 mg under the tongue every 5 (five) minutes as needed for chest pain.     Probiotic Product (PROBIOTIC PO) Take 1 capsule by mouth at bedtime.     thyroid (ARMOUR) 60 MG tablet Take 120 mg by mouth daily before breakfast.     tiZANidine (ZANAFLEX) 4 MG tablet Take 4 mg by mouth as needed.     No current facility-administered medications for this visit.    Allergies:   Latex, Lisinopril, Naproxen, Toradol [ketorolac tromethamine], and Synthroid [levothyroxine]      Exam:    Vital Signs:  BP 124/64 Comment: patient provided  Pulse 82 Comment: patient provided  Ht 5\' 7"  (1.702 m)   SpO2 98% Comment: patient provided  BMI 45.11 kg/m       Labs/Other Tests and Data Reviewed:    Recent Labs: 12/16/2021: ALT 19 12/17/2021: BUN 10; Creatinine, Ser 0.86; Hemoglobin 14.5; Magnesium 2.1; Platelets 187; Potassium 4.2; Sodium 140; TSH 3.176   Wt Readings from Last 3 Encounters:  01/27/22 288 lb (130.6 kg)  01/04/22 287 lb 9.6 oz (130.5 kg)  12/16/21 280 lb (127 kg)  ASSESSMENT & PLAN:    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   The patient  concerns regarding her medicines.  The following changes were made today: Diltiazem 120 twice daily  Labs/ tests ordered today include:  No orders of the defined types were placed in this encounter.  telehealth visit  in 2 weeks or so    Today, I have spent  15 minutes with the  patient with telehealth technology discussing the above.  Signed, Sherryl Manges, MD  05/11/2022 5:49 PM     [facility] [address] [place] [address] [phone] (office) [phone] (fax)

## 2022-05-11 NOTE — Patient Instructions (Signed)
Medication Instructions:  Your physician has recommended you make the following change in your medication:   ** Increase Diltiazem 120mg  from once daily to twice daily.  *If you need a refill on your cardiac medications before your next appointment, please call your pharmacy*   Lab Work: None ordered.  If you have labs (blood work) drawn today and your tests are completely normal, you will receive your results only by: MyChart Message (if you have MyChart) OR A paper copy in the mail If you have any lab test that is abnormal or we need to change your treatment, we will call you to review the results.   Testing/Procedures: None ordered.    Follow-Up: At [facility], you and your health needs are our priority.  As part of our continuing mission to provide you with exceptional heart care, we have created designated Provider Care Teams.  These Care Teams include your primary Cardiologist (physician) and Advanced Practice Providers (APPs -  Physician Assistants and Nurse Practitioners) who all work together to provide you with the care you need, when you need it.  We recommend signing up for the patient portal called "MyChart".  Sign up information is provided on this After Visit Summary.  MyChart is used to connect with patients for Virtual Visits (Telemedicine).  Patients are able to view lab/test results, encounter notes, upcoming appointments, etc.  Non-urgent messages can be sent to your provider as well.   To learn more about what you can do with MyChart, go to [email or web address].    Your next appointment:   10-14 day follow up with Dr Graciela Husbands - I will have his scheduler contact you next week to schedule.

## 2022-05-11 NOTE — Telephone Encounter (Signed)
..   Pt understands that although there may be some limitations with this type of visit, we will take all precautions to reduce any security or privacy concerns.  Pt understands that this will be treated like an in office visit and we will file with pt's insurance, and there may be a patient responsible charge related to this service. ? ?

## 2022-05-24 ENCOUNTER — Ambulatory Visit: Payer: Medicare Other | Attending: Internal Medicine | Admitting: Internal Medicine

## 2022-05-24 ENCOUNTER — Encounter: Payer: Self-pay | Admitting: Internal Medicine

## 2022-05-24 ENCOUNTER — Telehealth: Payer: Self-pay

## 2022-05-24 VITALS — BP 124/66 | HR 73 | Ht 67.0 in

## 2022-05-24 DIAGNOSIS — R002 Palpitations: Secondary | ICD-10-CM | POA: Diagnosis not present

## 2022-05-24 DIAGNOSIS — I471 Supraventricular tachycardia, unspecified: Secondary | ICD-10-CM

## 2022-05-24 NOTE — Progress Notes (Signed)
Electrophysiology TeleHealth Note      Date:  05/24/2022   ID:  Pamela [place], DOB [birth year], MRN [id number]  Location: patient's home  Provider location: [address], [place]  Evaluation Performed: Follow-up visit  PCP:  Lorenda Ishihara, MD  Cardiologist:    Electrophysiologist:  SK   Chief Complaint:  palpitations   History of Present Illness:    Pamela [place] is a 62 y.o. female who presents via audio/video conferencing for a telehealth visit today.  Since last being seen in our clinic for palpitations PVCs 14.9 % for which she was started on flecainide at 50 bid but which was subsequently complicated by wheezing (review suggest up to 10%), the patient reports doing much better than off the flecainide at 25 twice daily and have increased to 25 tid Interval episode "SVT" last week >> 15-20 min Now daily  Dilt 120 increased to bid>> improved  Event recorder     The patient denies symptoms of fevers, chills, cough, or new SOB worrisome for COVID 19.   Past Medical History:  Diagnosis Date   Hypertension    Osteoarthritis    Thyroid disease     Past Surgical History:  Procedure Laterality Date   ABDOMINAL HYSTERECTOMY     APPENDECTOMY     CYST REMOVAL HAND     SKIN GRAFT     THYROIDECTOMY, PARTIAL      Current Outpatient Medications  Medication Sig Dispense Refill   Cholecalciferol (VITAMIN D) 50 MCG (2000 UT) tablet Take 2,000 Units by mouth at bedtime.     cyanocobalamin (VITAMIN B12) 1000 MCG tablet Take 1,000 mcg by mouth 2 (two) times a week.     diclofenac Sodium (VOLTAREN) 1 % GEL      diltiazem (CARDIZEM CD) 120 MG 24 hr capsule Take 1 capsule (120 mg total) by mouth daily. (Patient taking differently: Take 120 mg by mouth 2 (two) times daily.) 90 capsule 3   flecainide (TAMBOCOR) 50 MG tablet Take 1/2 tablet ( ) by mouth 2 times daily and may increase to 3 times daily if palpitations persist. (Patient taking  differently: Take 1/2 tablet ( ) by mouth 3 times a day) 135 tablet 3   Homeopathic Products ([place]) SUBL Place under the tongue as needed.     meloxicam (MOBIC) 15 MG tablet Take 15 mg by mouth daily as needed for pain.     nitroGLYCERIN (NITROSTAT) 0.4 MG SL tablet Place 0.4 mg under the tongue every 5 (five) minutes as needed for chest pain.     Probiotic Product (PROBIOTIC PO) Take 1 capsule by mouth at bedtime.     thyroid (ARMOUR) 60 MG tablet Take 120 mg by mouth daily before breakfast.     tiZANidine (ZANAFLEX) 4 MG tablet Take 4 mg by mouth as needed.     No current facility-administered medications for this visit.    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  Showing some episodes of atrial tachycardia and is consistent with her complaint, trying to add a low-dose beta-blocker metoprolol to tartrate 25 twice daily and reducing the diltiazem to 120 mg daily   Requested iRhythm >> most symptoms with PVCs  some atrial tach    Current medicines are reviewed at length with the patient today.   The patient  concerns regarding  her medicines.  The following changes were made today: Diltiazem 120 twice daily  Labs/ tests ordered today include:  No orders of the defined types were placed in this encounter.  telehealth visit  in very good  4 weeks or so    Today, I have spent 21 minutes with the patient with telehealth technology discussing the above.  Signed, Sherryl Manges, MD  05/24/2022 5:14 PM     [facility] [address] [place] [address] [phone] (office) [phone] (fax)

## 2022-05-24 NOTE — Telephone Encounter (Signed)
..   Pt understands that although there may be some limitations with this type of visit, we will take all precautions to reduce any security or privacy concerns.  Pt understands that this will be treated like an in office visit and we will file with pt's insurance, and there may be a patient responsible charge related to this service. ? ?

## 2022-06-08 ENCOUNTER — Encounter: Payer: Self-pay | Admitting: Internal Medicine

## 2022-06-08 MED ORDER — METOPROLOL TARTRATE 25 MG PO TABS: 25.0000 mg | ORAL_TABLET | Freq: Two times a day (BID) | ORAL | 3 refills | Status: DC

## 2022-06-08 NOTE — Telephone Encounter (Signed)
Metoprolol Tartrate 25mg  - 1 tablet twice daily sent to pt's pharmacy as requested.  See excerpt from Dr Odessa Fleming last note on 05/24/2022.   PVCs well suppressed on flec BP up and improved with dilt 120 bid, but with more tachypalps and event recorder review>.  Showing some episodes of atrial tachycardia and is consistent with her complaint, trying to add a low-dose beta-blocker metoprolol to tartrate 25 twice daily and reducing the diltiazem to 120 mg daily     Requested iRhythm >> most symptoms with PVCs  some atrial tach     Current medicines are reviewed at length with the patient today.   The patient  concerns regarding her medicines.  The following changes were made today: Diltiazem 120 twice daily

## 2022-06-28 DIAGNOSIS — E538 Deficiency of other specified B group vitamins: Secondary | ICD-10-CM | POA: Diagnosis not present

## 2022-06-28 DIAGNOSIS — I10 Essential (primary) hypertension: Secondary | ICD-10-CM | POA: Diagnosis not present

## 2022-06-28 DIAGNOSIS — N200 Calculus of kidney: Secondary | ICD-10-CM | POA: Diagnosis not present

## 2022-06-28 DIAGNOSIS — E559 Vitamin D deficiency, unspecified: Secondary | ICD-10-CM | POA: Diagnosis not present

## 2022-06-28 DIAGNOSIS — E039 Hypothyroidism, unspecified: Secondary | ICD-10-CM | POA: Diagnosis not present

## 2022-07-04 ENCOUNTER — Ambulatory Visit: Payer: Medicare Other | Attending: Nurse Practitioner | Admitting: Nurse Practitioner

## 2022-07-04 ENCOUNTER — Encounter: Payer: Self-pay | Admitting: Nurse Practitioner

## 2022-07-04 VITALS — BP 134/72 | HR 61 | Ht 67.0 in | Wt 294.6 lb

## 2022-07-04 DIAGNOSIS — E039 Hypothyroidism, unspecified: Secondary | ICD-10-CM | POA: Diagnosis not present

## 2022-07-04 DIAGNOSIS — I493 Ventricular premature depolarization: Secondary | ICD-10-CM | POA: Diagnosis not present

## 2022-07-04 DIAGNOSIS — I10 Essential (primary) hypertension: Secondary | ICD-10-CM | POA: Insufficient documentation

## 2022-07-04 DIAGNOSIS — R002 Palpitations: Secondary | ICD-10-CM | POA: Diagnosis not present

## 2022-07-04 DIAGNOSIS — I471 Supraventricular tachycardia, unspecified: Secondary | ICD-10-CM | POA: Diagnosis not present

## 2022-07-04 NOTE — Patient Instructions (Signed)
Medication Instructions:  Your physician recommends that you continue on your current medications as directed. Please refer to the Current Medication list given to you today.  *If you need a refill on your cardiac medications before your next appointment, please call your pharmacy*   Lab Work: NONE ordered at this time of appointment    Testing/Procedures: NONE ordered at this time of appointment     Follow-Up: At [facility], you and your health needs are our priority.  As part of our continuing mission to provide you with exceptional heart care, we have created designated Provider Care Teams.  These Care Teams include your primary Cardiologist (physician) and Advanced Practice Providers (APPs -  Physician Assistants and Nurse Practitioners) who all work together to provide you with the care you need, when you need it.  We recommend signing up for the patient portal called "MyChart".  Sign up information is provided on this After Visit Summary.  MyChart is used to connect with patients for Virtual Visits (Telemedicine).  Patients are able to view lab/test results, encounter notes, upcoming appointments, etc.  Non-urgent messages can be sent to your provider as well.   To learn more about what you can do with MyChart, go to [email or web address].    Your next appointment:   6 months  Provider:   Dr. Jens Som     Other Instructions

## 2022-07-04 NOTE — Progress Notes (Signed)
Office Visit    Patient Name: Pamela Jenkins Date of Encounter: 07/04/2022  Primary Care Provider:  Lorenda Ishihara, MD Primary Cardiologist:  Pamela Millers, MD  Chief Complaint    61 year old female with a history of palpitations, SVT, frequent PVCs, hypertension, hypothyroidism, osteoarthritis, and legal blindness who presents for follow-up related to palpitations.  Past Medical History    Past Medical History:  Diagnosis Date   Hypertension    Osteoarthritis    Thyroid disease    Past Surgical History:  Procedure Laterality Date   ABDOMINAL HYSTERECTOMY     APPENDECTOMY     CYST REMOVAL HAND     SKIN GRAFT     THYROIDECTOMY, PARTIAL      Allergies  Allergies  Allergen Reactions   Latex Anaphylaxis   Lisinopril Cough   Naproxen Hives   Timolol Maleate Cough   Toradol [Ketorolac Tromethamine] Hives   Synthroid [Levothyroxine] Palpitations     Labs/Other Studies Reviewed    The following studies were reviewed today:  Cardiac Studies & Procedures       ECHOCARDIOGRAM  ECHOCARDIOGRAM COMPLETE 12/17/2021  Narrative ECHOCARDIOGRAM REPORT    Patient Name:   Pamela Jenkins Date of Exam: 12/17/2021 Medical Rec #:  [id number]         Height:       67.5 in Accession #:    [id number]        Weight:       280.0 lb Date of Birth:  1960/08/08          BSA:          2.346 m Patient Age:    61 years          BP:           137/84 mmHg Patient Gender: F                 HR:           61 bpm. Exam Location:  Inpatient  Procedure: 2D Echo, Color Doppler and Cardiac Doppler  Indications:    R07.9* Chest pain, unspecified  History:        Patient has no prior history of Echocardiogram examinations. Risk Factors:Hypertension.  Sonographer:    Pamela Jenkins Senior RDCS Referring Phys: Pamela Jenkins   Sonographer Comments: Technically difficult due to body habitus. IMPRESSIONS   1. Left ventricular ejection fraction, by estimation, is 50 to 55%. The left  ventricle has low normal function. The left ventricle has no regional wall motion abnormalities. The left ventricular internal cavity size was mildly dilated. Left ventricular diastolic parameters were normal. 2. Right ventricular systolic function is normal. The right ventricular size is mildly enlarged. Tricuspid regurgitation signal is inadequate for assessing PA pressure. 3. The mitral valve is normal in structure. Mild mitral valve regurgitation. No evidence of mitral stenosis. 4. The aortic valve is tricuspid. Aortic valve regurgitation is not visualized. Aortic valve sclerosis/calcification is present, without any evidence of aortic stenosis. 5. Aortic dilatation noted. There is mild dilatation of the ascending aorta, measuring 38 mm. 6. The inferior vena cava is normal in size with greater than 50% respiratory variability, suggesting right atrial pressure of 3 mmHg.  FINDINGS Left Ventricle: Left ventricular ejection fraction, by estimation, is 50 to 55%. The left ventricle has low normal function. The left ventricle has no regional wall motion abnormalities. The left ventricular internal cavity size was mildly dilated. There is no left ventricular hypertrophy. Left ventricular diastolic parameters were normal. Normal  left ventricular filling pressure.  Right Ventricle: The right ventricular size is mildly enlarged. No increase in right ventricular wall thickness. Right ventricular systolic function is normal. Tricuspid regurgitation signal is inadequate for assessing PA pressure.  Left Atrium: Left atrial size was normal in size.  Right Atrium: Right atrial size was normal in size.  Pericardium: There is no evidence of pericardial effusion.  Mitral Valve: The mitral valve is normal in structure. Mild mitral valve regurgitation. No evidence of mitral valve stenosis.  Tricuspid Valve: The tricuspid valve is normal in structure. Tricuspid valve regurgitation is trivial. No evidence of  tricuspid stenosis.  Aortic Valve: The aortic valve is tricuspid. Aortic valve regurgitation is not visualized. Aortic valve sclerosis/calcification is present, without any evidence of aortic stenosis.  Pulmonic Valve: The pulmonic valve was normal in structure. Pulmonic valve regurgitation is trivial. No evidence of pulmonic stenosis.  Aorta: Aortic dilatation noted. There is mild dilatation of the ascending aorta, measuring 38 mm.  Venous: The inferior vena cava is normal in size with greater than 50% respiratory variability, suggesting right atrial pressure of 3 mmHg.  IAS/Shunts: No atrial level shunt detected by color flow Doppler.   LEFT VENTRICLE PLAX 2D LVIDd:         5.40 cm   Diastology LVIDs:         4.00 cm   LV e' medial:    7.94 cm/s LV PW:         0.80 cm   LV E/e' medial:  8.0 LV IVS:        0.70 cm   LV e' lateral:   7.72 cm/s LVOT diam:     2.10 cm   LV E/e' lateral: 8.2 LV SV:         61 LV SV Index:   26 LVOT Area:     3.46 cm   RIGHT VENTRICLE RV S prime:     10.30 cm/s TAPSE (M-mode): 1.9 cm  LEFT ATRIUM             Index        RIGHT ATRIUM           Index LA diam:        3.50 cm 1.49 cm/m   RA Area:     18.90 cm LA Vol (A2C):   86.9 ml 37.04 ml/m  RA Volume:   55.30 ml  23.57 ml/m LA Vol (A4C):   63.1 ml 26.90 ml/m LA Biplane Vol: 75.4 ml 32.14 ml/m AORTIC VALVE LVOT Vmax:   71.50 cm/s LVOT Vmean:  57.300 cm/s LVOT VTI:    0.175 m  AORTA Ao Root diam: 3.30 cm Ao Asc diam:  3.80 cm  MITRAL VALVE MV Area (PHT): 4.01 cm    SHUNTS MV Decel Time: 189 msec    Systemic VTI:  0.18 m MV E velocity: 63.20 cm/s  Systemic Diam: 2.10 cm MV A velocity: 52.50 cm/s MV E/A ratio:  1.20  Pamela Magic MD Electronically signed by Pamela Magic MD Signature Date/Time: 12/17/2021/12:41:41 PM    Final     CT SCANS  CT CORONARY MORPH W/CTA COR W/SCORE 02/07/2022  Addendum 02/07/2022  2:25 PM ADDENDUM REPORT: 02/07/2022 14:22  EXAM: OVER-READ  INTERPRETATION  CT CHEST  The following report is an over-read performed by radiologist Dr. [facility] on 02/07/2022. This over-read does not include interpretation of cardiac or coronary anatomy or pathology. The coronary CTA interpretation by the cardiologist is attached.  COMPARISON:  None.  FINDINGS: Normal caliber thoracic aorta.  No dissection.  No mediastinal or hilar mass or lymphadenopathy. The esophagus is unremarkable. Small hiatal hernia.  No acute pulmonary process. No worrisome pulmonary lesions or pleural effusions.  No significant upper abdominal findings.  No significant bony findings.  IMPRESSION: 1. No significant extracardiac findings. 2. Small hiatal hernia.   Electronically Signed By: Rudie Meyer M.D. On: 02/07/2022 14:22  Narrative CLINICAL DATA:  Chest pain  EXAM: Cardiac/Coronary CTA  TECHNIQUE: A non-contrast, gated CT scan was obtained with axial slices of 3 mm through the heart for calcium scoring. Calcium scoring was performed using the Agatston method. A 120 kV prospective, gated, contrast cardiac scan was obtained. Gantry rotation speed was 250 msecs and collimation was 0.6 mm. Two sublingual nitroglycerin tablets (0.8 mg) were given. The 3D data set was reconstructed in 5% intervals of the 35-75% of the R-R cycle. Diastolic phases were analyzed on a dedicated workstation using MPR, MIP, and VRT modes. The patient received 95 cc of contrast.  FINDINGS: Image quality: Good.  Noise artifact is: Mild.  Coronary Arteries:  Normal coronary origin.  Right dominance.  Left main: The left main is a large caliber vessel with a normal take off from the left coronary cusp that bifurcates to form a left anterior descending artery and a left circumflex artery. There is no plaque or stenosis.  Left anterior descending artery: The LAD is patent without evidence of plaque or stenosis. The LAD gives off 1 patent  diagonal branch.  Left circumflex artery: The LCX is non-dominant and patent with no evidence of plaque or stenosis. The LCX gives off 2 patent obtuse marginal branches.  Right coronary artery: The RCA is dominant with normal take off from the right coronary cusp. There is no evidence of plaque or stenosis. The RCA terminates as a PDA and right posterolateral branch without evidence of plaque or stenosis.  Right Atrium: Right atrial size is within normal limits.  Right Ventricle: The right ventricular cavity is within normal limits.  Left Atrium: Left atrial size is normal in size with no left atrial appendage filling defect.  Left Ventricle: The ventricular cavity size is within normal limits. There are no stigmata of prior infarction. There is no abnormal filling defect.  Pulmonary arteries: Normal in size without proximal filling defect.  Pulmonary veins: Normal pulmonary venous drainage.  Pericardium: Normal thickness with no significant effusion or calcium present.  Cardiac valves: The aortic valve is trileaflet without significant calcification. The mitral valve is normal structure without significant calcification.  Aorta: Normal caliber with no significant disease.  Extra-cardiac findings: See attached radiology report for non-cardiac structures.  IMPRESSION: 1. Coronary calcium score of 0. This was 0 percentile for age-, sex, and race-matched controls.  2. Normal coronary origin with right dominance.  3. Normal coronary arteries.  CAD RADS 0.  RECOMMENDATIONS: 1. CAD-RADS 0: No evidence of CAD (0%). Consider non-atherosclerotic causes of chest pain.  2. CAD-RADS 1: Minimal non-obstructive CAD (0-24%). Consider non-atherosclerotic causes of chest pain. Consider preventive therapy and risk factor modification.  3. CAD-RADS 2: Mild non-obstructive CAD (25-49%). Consider non-atherosclerotic causes of chest pain. Consider preventive therapy and risk factor  modification.  4. CAD-RADS 3: Moderate stenosis. Consider symptom-guided anti-ischemic pharmacotherapy as well as risk factor modification per guideline directed care. Additional analysis with CT FFR will be submitted.  5. CAD-RADS 4: Severe stenosis. (70-99% or > 50% left main). Cardiac catheterization or CT FFR is recommended.  Consider symptom-guided anti-ischemic pharmacotherapy as well as risk factor modification per guideline directed care. Invasive coronary angiography recommended with revascularization per published guideline statements.  6. CAD-RADS 5: Total coronary occlusion (100%). Consider cardiac catheterization or viability assessment. Consider symptom-guided anti-ischemic pharmacotherapy as well as risk factor modification per guideline directed care.  7. CAD-RADS N: Non-diagnostic study. Obstructive CAD can't be excluded. Alternative evaluation is recommended.  Pamela Magic, MD  Electronically Signed: By: Pamela Jenkins M.D. On: 02/07/2022 13:08         Recent Labs: 12/16/2021: ALT 19 12/17/2021: BUN 10; Creatinine, Ser 0.86; Hemoglobin 14.5; Magnesium 2.1; Platelets 187; Potassium 4.2; Sodium 140; TSH 3.176  Recent Lipid Panel No results found for: "CHOL", "TRIG", "HDL", "CHOLHDL", "VLDL", "LDLCALC", "LDLDIRECT"  History of Present Illness    62 year old female with the  above past medical history including palpitations, SVT, frequent PVCs, hypertension, hypothyroidism, osteoarthritis, and legal blindness.  She was initially evaluated by cardiology during that admission in November 2023 in the setting of chest pain and palpitations.  ZIO monitor showed 12 runs of SVT, fastest interval lasting 10 beats, longest lasting 13.5 seconds, as well as frequent PVCs (14.9% burden).  Echocardiogram showed EF 50 to 55%, normal biventricular function, mild MR, and  mild dilation of the ascending aorta measuring 38 mm.  She was referred to EP given high PVC burden.  She was  started on flecainide and diltiazem.  Coronary calcium score in 01/2022 showed coronary calcium score of 0, no evidence of CAD.  She was last seen virtually by EP on 05/24/2022.  She was doing well from a cardiac standpoint.  She was started on metoprolol to tartrate 25 mg twice daily in the setting of ongoing intermittent atrial tachycardia. Diltiazem was reduced to 120 mg daily.  She presents today for follow-up.  Since her last visit she has been stable overall from a cardiac standpoint.  She notes that she recently ran out of her diltiazem and has not taken it at all this week.  She should receive her prescription towards the end of the week.  She notes stable intermittent fleeting palpitations, denies any significant palpitations, denies chest pain, dyspnea, dizziness, presyncope, syncope.  She has stable chronic nonpitting lower extremity edema.  She denies PND, orthopnea, weight gain.  Overall, she reports feeling well.  Home Medications    Current Outpatient Medications  Medication Sig Dispense Refill   Cholecalciferol (VITAMIN D) 50 MCG (2000 UT) tablet Take 2,000 Units by mouth at bedtime.     cyanocobalamin (VITAMIN B12) 1000 MCG tablet Take 1,000 mcg by mouth 2 (two) times a week.     diltiazem (CARDIZEM CD) 120 MG 24 hr capsule Take 1 capsule (120 mg total) by mouth daily. (Patient taking differently: Take 120 mg by mouth 2 (two) times daily.) 90 capsule 3   flecainide (TAMBOCOR) 50 MG tablet Take 1/2 tablet (25mg ) by mouth 2 times daily and may increase to 3 times daily if palpitations persist. (Patient taking differently: Take 1/2 tablet (25mg ) by mouth 3 times a day) 135 tablet 3   Homeopathic Products (ARN[place]) SUBL Place under the tongue as needed.     meloxicam (MOBIC) 15 MG tablet Take 15 mg by mouth daily as needed for pain.     metoprolol tartrate (LOPRESSOR) 25 MG tablet Take 1 tablet (25 mg total) by mouth 2 (two) times daily. 180 tablet 3   nitroGLYCERIN (NITROSTAT) 0.4  MG SL tablet Place 0.4 mg under the tongue every 5 (  five) minutes as needed for chest pain.     Probiotic Product (PROBIOTIC PO) Take 1 capsule by mouth at bedtime.     thyroid (ARMOUR) 60 MG tablet Take 120 mg by mouth daily before breakfast.     diclofenac Sodium (VOLTAREN) 1 % GEL  (Patient not taking: Reported on 07/04/2022)     tiZANidine (ZANAFLEX) 4 MG tablet Take 4 mg by mouth as needed. (Patient not taking: Reported on 07/04/2022)     No current facility-administered medications for this visit.     Review of Systems    She denies chest pain, dyspnea, pnd, orthopnea, n, v, dizziness, syncope, weight gain, or early satiety. All other systems reviewed and are otherwise negative except as noted above.   Physical Exam    VS:  BP 134/72 (BP Location: Left Arm, Patient Position: Sitting, Cuff Size: Large)   Pulse 61   Ht 5\' 7"  (1.702 m)   Wt 294 lb 9.6 oz (133.6 kg)   SpO2 97%   BMI 46.14 kg/m  GEN: Well nourished, well developed, in no acute distress. HEENT: normal. Neck: Supple, no JVD, carotid bruits, or masses. Cardiac: RRR, no murmurs, rubs, or gallops. No clubbing, cyanosis, pitting bilateral lower extremity edema.  Radials/DP/PT 2+ and equal bilaterally.  Respiratory:  Respirations regular and unlabored, clear to auscultation bilaterally. GI: Soft, nontender, nondistended, BS + x 4. MS: no deformity or atrophy. Skin: warm and dry, no rash. Neuro:  Strength and sensation are intact. Psych: Normal affect.  Accessory Clinical Findings    ECG personally reviewed by me today -normal sinus rhythm, 61 bpm, difficult to interpret due to artifact- no acute changes.   Lab Results  Component Value Date   WBC 6.5 12/17/2021   HGB 14.5 12/17/2021   HCT 43.4 12/17/2021   MCV 92.9 12/17/2021   PLT 187 12/17/2021   Lab Results  Component Value Date   CREATININE 0.86 12/17/2021   BUN 10 12/17/2021   NA 140 12/17/2021   K 4.2 12/17/2021   CL 108 12/17/2021   CO2 21 (L)  12/17/2021   Lab Results  Component Value Date   ALT 19 12/16/2021   AST 25 12/16/2021   ALKPHOS 73 12/16/2021   BILITOT 0.8 12/16/2021   No results found for: "CHOL", "HDL", "LDLCALC", "LDLDIRECT", "TRIG", "CHOLHDL"  No results found for: "HGBA1C"  Assessment & Plan    1. Palpitations/SVT/PVCs: ZIO monitor in 11/2021 showed 12 runs of SVT, fastest interval lasting 10 beats, longest lasting 13.5 seconds, as well as frequent PVCs (14.9% burden).   Coronary calcium score in 01/2022 showed coronary calcium score of 0, no evidence of CAD.  Stable with no anginal symptoms.  She notes intermittent fleeting palpitations, denies any associated symptoms.  Overall stable.  She has been out of her diltiazem for the past few days but should receive her prescription towards the end of the week.  Discussed ED precautions.  Continue diltiazem, metoprolol, flecainide.  2. Hypertension: BP well controlled. Continue current antihypertensive regimen.   3. Hypothyroidism: TSH was 3.176 in 11/2021.  Monitored and managed per PCP.  4. Disposition: Follow-up as scheduled with Dr. Graciela Husbands in 08/2022, follow-up with general cardiology in 6 months with Dr. Jens Som.        Joylene Grapes, NP 07/04/2022, 4:14 PM

## 2022-08-28 DIAGNOSIS — I471 Supraventricular tachycardia, unspecified: Secondary | ICD-10-CM | POA: Insufficient documentation

## 2022-08-28 DIAGNOSIS — I493 Ventricular premature depolarization: Secondary | ICD-10-CM | POA: Insufficient documentation

## 2022-09-01 ENCOUNTER — Ambulatory Visit: Payer: Medicare Other | Attending: Internal Medicine | Admitting: Internal Medicine

## 2022-09-01 ENCOUNTER — Encounter: Payer: Self-pay | Admitting: Internal Medicine

## 2022-09-01 VITALS — BP 138/82 | HR 63 | Ht 67.0 in | Wt 300.0 lb

## 2022-09-01 DIAGNOSIS — I471 Supraventricular tachycardia, unspecified: Secondary | ICD-10-CM

## 2022-09-01 DIAGNOSIS — I493 Ventricular premature depolarization: Secondary | ICD-10-CM | POA: Diagnosis not present

## 2022-09-01 MED ORDER — BISOPROLOL FUMARATE 5 MG PO TABS: 2.5000 mg | ORAL_TABLET | Freq: Every day | ORAL | 3 refills | Status: DC

## 2022-09-01 MED ORDER — DILTIAZEM HCL ER COATED BEADS 120 MG PO CP24: 120.0000 mg | ORAL_CAPSULE | Freq: Every day | ORAL | 3 refills | Status: DC

## 2022-09-01 NOTE — Patient Instructions (Signed)
Medication Instructions:  Your physician has recommended you make the following change in your medication:   ** Stop Metoprolol Tartrate  ** Begin Bisoprolol 5mg  - 1/2 tablet (2.5mg ) by mouth daily.  *If you need a refill on your cardiac medications before your next appointment, please call your pharmacy*   Lab Work: None ordered.  If you have labs (blood work) drawn today and your tests are completely normal, you will receive your results only by: MyChart Message (if you have MyChart) OR A paper copy in the mail If you have any lab test that is abnormal or we need to change your treatment, we will call you to review the results.   Testing/Procedures: None ordered.    Follow-Up: At [facility], you and your health needs are our priority.  As part of our continuing mission to provide you with exceptional heart care, we have created designated Provider Care Teams.  These Care Teams include your primary Cardiologist (physician) and Advanced Practice Providers (APPs -  Physician Assistants and Nurse Practitioners) who all work together to provide you with the care you need, when you need it.  We recommend signing up for the patient portal called "MyChart".  Sign up information is provided on this After Visit Summary.  MyChart is used to connect with patients for Virtual Visits (Telemedicine).  Patients are able to view lab/test results, encounter notes, upcoming appointments, etc.  Non-urgent messages can be sent to your provider as well.   To learn more about what you can do with MyChart, go to [email or web address].    Your next appointment:   To be determined

## 2022-09-01 NOTE — Progress Notes (Unsigned)
Patient Care Team: Lorenda Ishihara, MD as PCP - General (Internal Medicine) Lewayne Bunting, MD as PCP - Cardiology (Cardiology) Duke Salvia, MD as PCP - Electrophysiology (Cardiology)   HPI  Pamela Jenkins is a 62 y.o. 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  Higher doses of metoprolol have also been associated with wheezing.  Breathing is otherwise stable.  No edema.  No chest pain.  Overall palps have been adequatley controlled with bid flec 25 and prn    Zio patch from 11/23 was reviewed.  12 episodes of SVT were noted up to 13.5 seconds PVCs were frequent 15%.  Lives by herself and has a dog named [name]   DATE TEST EF    11/23 Echo   50-55 %     1/24  CTA    CaScore 0    Date Cr K Hgb  11/23 0.86 4.2 14.5              Records and Results Reviewed   Past Medical History:  Diagnosis Date   Hypertension    Osteoarthritis    Thyroid disease     Past Surgical History:  Procedure Laterality Date   ABDOMINAL HYSTERECTOMY     APPENDECTOMY     CYST REMOVAL HAND     SKIN GRAFT     THYROIDECTOMY, PARTIAL      Current Meds  Medication Sig   Cholecalciferol (VITAMIN D) 50 MCG (2000 UT) tablet Take 2,000 Units by mouth at bedtime.   cyanocobalamin (VITAMIN B12) 1000 MCG tablet Take 1,000 mcg by mouth 2 (two) times a week.   diclofenac Sodium (VOLTAREN) 1 % GEL    diltiazem (CARDIZEM CD) 120 MG 24 hr capsule Take 1 capsule (120 mg total) by mouth daily. (Patient taking differently: Take 120 mg by mouth 2 (two) times daily.)   flecainide (TAMBOCOR) 50 MG tablet Take 1/2 tablet (25mg ) by mouth 2 times daily and may increase to 3 times daily if palpitations persist. (Patient taking differently:  Take 1/2 tablet (25mg ) by mouth 3 times a day)   Homeopathic Products ([place]) SUBL Place under the tongue as needed.   meloxicam (MOBIC) 15 MG tablet Take 15 mg by mouth daily as needed for pain.   metoprolol tartrate (LOPRESSOR) 25 MG tablet Take 1 tablet (25 mg total) by mouth 2 (two) times daily.   nitroGLYCERIN (NITROSTAT) 0.4 MG SL tablet Place 0.4 mg under the tongue every 5 (five) minutes as needed for chest pain.   Probiotic Product (PROBIOTIC PO) Take 1 capsule by mouth at bedtime.   thyroid (ARMOUR) 60 MG tablet Take 120 mg by mouth daily before breakfast.   tiZANidine (ZANAFLEX) 4 MG tablet Take 4 mg by mouth as needed.    Allergies  Allergen Reactions   Latex Anaphylaxis   Lisinopril Cough   Naproxen Hives   Timolol Maleate Cough   Toradol [Ketorolac Tromethamine] Hives   Synthroid [Levothyroxine] Palpitations      Review of Systems negative except from HPI and PMH  Physical Exam BP 138/82   Pulse 63   Ht 5\' 7"  (1.702 m)   Wt 300 lb (136.1 kg)   SpO2 95%   BMI 46.99 kg/m  Well developed and Morbidly obese  in no acute distress HENT normal E scleral and icterus clear Neck Supple JVP flat; carotids brisk and full Clear to ausculation Regular rate and rhythm, no murmurs gallops or rub Soft with active bowel sounds No clubbing cyanosis  Edema Alert and oriented, grossly normal motor and sensory function Skin Warm and Dry  ECG probable sinus at 63 (P wave is biphasic/negative in the inferior leads suggesting a low ectopic origin Interval 17/09/38    CrCl cannot be calculated (Patient's most recent lab result is older than the maximum 21 days allowed.).   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  Up titration of flecainide has been limited by wheezing.  Will continue at 25 twice daily +25 as needed.  With her history of wheezing we will change her metoprolol to bisoprolol for greater selectivity.  Will refill her diltiazem.    Blood pressure is reasonably controlled on the calcium/beta-blocker combination  She is euvolemic.     Current medicines are reviewed at length with the patient today .  The patient does not  have concerns regarding medicines.

## 2022-11-02 DIAGNOSIS — I10 Essential (primary) hypertension: Secondary | ICD-10-CM | POA: Diagnosis not present

## 2022-11-02 DIAGNOSIS — E538 Deficiency of other specified B group vitamins: Secondary | ICD-10-CM | POA: Diagnosis not present

## 2022-11-02 DIAGNOSIS — M6530 Trigger finger, unspecified finger: Secondary | ICD-10-CM | POA: Diagnosis not present

## 2022-11-02 DIAGNOSIS — E559 Vitamin D deficiency, unspecified: Secondary | ICD-10-CM | POA: Diagnosis not present

## 2022-11-02 DIAGNOSIS — M129 Arthropathy, unspecified: Secondary | ICD-10-CM | POA: Diagnosis not present

## 2022-11-02 DIAGNOSIS — Z23 Encounter for immunization: Secondary | ICD-10-CM | POA: Diagnosis not present

## 2022-11-16 ENCOUNTER — Ambulatory Visit
Admission: RE | Admit: 2022-11-16 | Discharge: 2022-11-16 | Disposition: A | Discharge status: Home / Self Care | Payer: Medicare Other | Source: Ambulatory Visit | Attending: Internal Medicine | Admitting: Internal Medicine

## 2022-11-16 ENCOUNTER — Other Ambulatory Visit: Payer: Self-pay | Admitting: Internal Medicine

## 2022-11-16 DIAGNOSIS — M19041 Primary osteoarthritis, right hand: Secondary | ICD-10-CM | POA: Diagnosis not present

## 2022-11-16 DIAGNOSIS — M19049 Primary osteoarthritis, unspecified hand: Secondary | ICD-10-CM

## 2022-11-16 DIAGNOSIS — M79641 Pain in right hand: Secondary | ICD-10-CM | POA: Diagnosis not present

## 2022-11-21 DIAGNOSIS — Z23 Encounter for immunization: Secondary | ICD-10-CM | POA: Diagnosis not present

## 2022-12-04 DIAGNOSIS — M13841 Other specified arthritis, right hand: Secondary | ICD-10-CM | POA: Diagnosis not present

## 2022-12-04 DIAGNOSIS — M25641 Stiffness of right hand, not elsewhere classified: Secondary | ICD-10-CM | POA: Diagnosis not present

## 2022-12-27 DIAGNOSIS — N200 Calculus of kidney: Secondary | ICD-10-CM | POA: Diagnosis not present

## 2022-12-27 DIAGNOSIS — E559 Vitamin D deficiency, unspecified: Secondary | ICD-10-CM | POA: Diagnosis not present

## 2022-12-27 DIAGNOSIS — E538 Deficiency of other specified B group vitamins: Secondary | ICD-10-CM | POA: Diagnosis not present

## 2022-12-27 DIAGNOSIS — I10 Essential (primary) hypertension: Secondary | ICD-10-CM | POA: Diagnosis not present

## 2022-12-27 DIAGNOSIS — E890 Postprocedural hypothyroidism: Secondary | ICD-10-CM | POA: Diagnosis not present

## 2022-12-27 DIAGNOSIS — K760 Fatty (change of) liver, not elsewhere classified: Secondary | ICD-10-CM | POA: Diagnosis not present

## 2023-01-22 NOTE — Progress Notes (Deleted)
HPI: Follow-up palpitations, PVCs and history of SVT.  Monitor November 2023 showed sinus rhythm with short runs of SVT (longest 13.5 seconds), frequent PVCs.  Echocardiogram showed ejection fraction 50 to 55%, mild mitral regurgitation.  Patient seen by Dr. Graciela Husbands and started on flecainide and Cardizem.  Coronary CTA January 2024 showed normal coronary arteries and calcium score 0.  Since last seen  Current Outpatient Medications  Medication Sig Dispense Refill   bisoprolol (ZEBETA) 5 MG tablet Take 0.5 tablets (2.5 mg total) by mouth daily. 45 tablet 3   Cholecalciferol (VITAMIN D) 50 MCG (2000 UT) tablet Take 2,000 Units by mouth at bedtime.     cyanocobalamin (VITAMIN B12) 1000 MCG tablet Take 1,000 mcg by mouth 2 (two) times a week.     diclofenac Sodium (VOLTAREN) 1 % GEL      diltiazem (CARDIZEM CD) 120 MG 24 hr capsule Take 1 capsule (120 mg total) by mouth daily. 90 capsule 3   flecainide (TAMBOCOR) 50 MG tablet Take 1/2 tablet (25mg ) by mouth 2 times daily and may increase to 3 times daily if palpitations persist. (Patient taking differently: Take 1/2 tablet (25mg ) by mouth 3 times a day) 135 tablet 3   Homeopathic Products ([place]ICA [place]) SUBL Place under the tongue as needed.     meloxicam (MOBIC) 15 MG tablet Take 15 mg by mouth daily as needed for pain.     nitroGLYCERIN (NITROSTAT) 0.4 MG SL tablet Place 0.4 mg under the tongue every 5 (five) minutes as needed for chest pain.     Probiotic Product (PROBIOTIC PO) Take 1 capsule by mouth at bedtime.     thyroid (ARMOUR) 60 MG tablet Take 120 mg by mouth daily before breakfast.     tiZANidine (ZANAFLEX) 4 MG tablet Take 4 mg by mouth as needed.     No current facility-administered medications for this visit.     Past Medical History:  Diagnosis Date   Hypertension    Osteoarthritis    Thyroid disease     Past Surgical History:  Procedure Laterality Date   ABDOMINAL HYSTERECTOMY     APPENDECTOMY     CYST REMOVAL  HAND     SKIN GRAFT     THYROIDECTOMY, PARTIAL      Social History   Socioeconomic History   Marital status: Single    Spouse name: Not on file   Number of children: Not on file   Years of education: Not on file   Highest education level: Not on file  Occupational History   Not on file  Tobacco Use   Smoking status: Unknown   Smokeless tobacco: Not on file  Substance and Sexual Activity   Alcohol use: Not on file   Drug use: Not on file   Sexual activity: Not on file  Other Topics Concern   Not on file  Social History Narrative   Not on file   Social Drivers of Health   Financial Resource Strain: Not on file  Food Insecurity: Not on file  Transportation Needs: Not on file  Physical Activity: Not on file  Stress: Not on file  Social Connections: Not on file  Intimate Partner Violence: Not on file    Family History  Problem Relation Age of Onset   Heart attack Father    Breast cancer Neg Hx     ROS: no fevers or chills, productive cough, hemoptysis, dysphasia, odynophagia, melena, hematochezia, dysuria, hematuria, rash, seizure activity, orthopnea, PND, pedal  edema, claudication. Remaining systems are negative.  Physical Exam: Well-developed well-nourished in no acute distress.  Skin is warm and dry.  HEENT is normal.  Neck is supple.  Chest is clear to auscultation with normal expansion.  Cardiovascular exam is regular rate and rhythm.  Abdominal exam nontender or distended. No masses palpated. Extremities show no edema. neuro grossly intact  ECG- personally reviewed  A/P  1 palpitations/PVCs/SVT-patient symptoms are well-controlled.  Will continue flecainide, metoprolol and Cardizem.  2 hypertension-patient's blood pressure is controlled.  Continue present medical regimen.  Olga Millers, MD

## 2023-01-26 ENCOUNTER — Ambulatory Visit: Payer: Medicare Other | Admitting: Cardiology

## 2023-01-26 ENCOUNTER — Encounter: Payer: Self-pay | Admitting: Cardiology

## 2023-01-26 MED ORDER — NITROGLYCERIN 0.4 MG SL SUBL: 0.4000 mg | SUBLINGUAL_TABLET | SUBLINGUAL | 3 refills | Status: AC | PRN

## 2023-01-30 ENCOUNTER — Encounter: Payer: Self-pay | Admitting: Cardiology

## 2023-02-01 MED ORDER — FLECAINIDE ACETATE 50 MG PO TABS: ORAL_TABLET | ORAL | 2 refills | Status: DC

## 2023-02-06 DIAGNOSIS — I471 Supraventricular tachycardia, unspecified: Secondary | ICD-10-CM | POA: Diagnosis not present

## 2023-02-06 DIAGNOSIS — M150 Primary generalized (osteo)arthritis: Secondary | ICD-10-CM | POA: Diagnosis not present

## 2023-02-06 DIAGNOSIS — R2689 Other abnormalities of gait and mobility: Secondary | ICD-10-CM | POA: Diagnosis not present

## 2023-02-06 DIAGNOSIS — F3340 Major depressive disorder, recurrent, in remission, unspecified: Secondary | ICD-10-CM | POA: Diagnosis not present

## 2023-02-06 DIAGNOSIS — I10 Essential (primary) hypertension: Secondary | ICD-10-CM | POA: Diagnosis not present

## 2023-02-06 DIAGNOSIS — F419 Anxiety disorder, unspecified: Secondary | ICD-10-CM | POA: Diagnosis not present

## 2023-02-06 DIAGNOSIS — M1990 Unspecified osteoarthritis, unspecified site: Secondary | ICD-10-CM | POA: Diagnosis not present

## 2023-02-06 DIAGNOSIS — E890 Postprocedural hypothyroidism: Secondary | ICD-10-CM | POA: Diagnosis not present

## 2023-04-11 ENCOUNTER — Encounter: Payer: Self-pay | Admitting: Cardiology

## 2023-04-11 ENCOUNTER — Other Ambulatory Visit (HOSPITAL_COMMUNITY): Payer: Self-pay

## 2023-04-11 MED ORDER — DILTIAZEM HCL ER COATED BEADS 120 MG PO CP24: 120.0000 mg | ORAL_CAPSULE | Freq: Every day | ORAL | 0 refills | Status: DC

## 2023-04-11 MED ORDER — BISOPROLOL FUMARATE 5 MG PO TABS
2.5000 mg | ORAL_TABLET | Freq: Every day | ORAL | 0 refills | Status: DC
  Filled 2023-04-11: qty 12.5, 25d supply, fill #0

## 2023-04-11 MED ORDER — BISOPROLOL FUMARATE 5 MG PO TABS: 2.5000 mg | ORAL_TABLET | Freq: Every day | ORAL | 0 refills | Status: DC

## 2023-04-11 NOTE — Telephone Encounter (Signed)
 Called patient. Name and DOB verified. Patient stated she is out of refills for Diltiazem 120 mg daily and Bisolprolol 2.5 mg daily and needs a new prescription sent to pharmacy. I saw last time medication was ordered for a year's supply was in Aug 2024. Since year supply should not be out yet, I called pharmacy to confirm when last refill picked up for both medications, they were picked up 01/26/2023. I let patient know I can send a prescription for medication to last until next appointment. Patient has appt to see Dr. Jens Som on 04/30/2023. Patient verbalized understanding.  Josie LPN

## 2023-04-18 NOTE — Progress Notes (Signed)
 HPI: FU palpitations. Monitor 11/23 showed sinus with PACs, short runs of SVT (longest 13.5 sec), PVCs. Echo 11/23 showed normal LV function, mild LVE, mild RVE, mild MR. Coronary CTA 1/24 showed Ca score 0 and no CAD. Now treated with cardizem, metoprolol and flecainide. Since last seen, she continues to have occasional palpitations.  They are particularly bad the last week of February in first week of March.  However otherwise reasonly well-controlled.  She denies dyspnea, chest pain or syncope.  Current Outpatient Medications  Medication Sig Dispense Refill   bisoprolol (ZEBETA) 5 MG tablet Take 0.5 tablets (2.5 mg total) by mouth daily. 20 tablet 0   Cholecalciferol (VITAMIN D) 50 MCG (2000 UT) tablet Take 2,000 Units by mouth at bedtime.     cyanocobalamin (VITAMIN B12) 1000 MCG tablet Take 1,000 mcg by mouth 2 (two) times a week.     diclofenac Sodium (VOLTAREN) 1 % GEL      diltiazem (CARDIZEM CD) 120 MG 24 hr capsule Take 1 capsule (120 mg total) by mouth daily. 30 capsule 0   flecainide (TAMBOCOR) 50 MG tablet Take 1/2 tablet (25mg ) by mouth 2 times daily and may increase to 3 times daily if palpitations persist. 135 tablet 2   Homeopathic Products ([place]) SUBL Place under the tongue as needed.     meloxicam (MOBIC) 15 MG tablet Take 15 mg by mouth daily as needed for pain.     nitroGLYCERIN (NITROSTAT) 0.4 MG SL tablet Place 1 tablet (0.4 mg total) under the tongue every 5 (five) minutes as needed for chest pain. 25 tablet 3   Probiotic Product (PROBIOTIC PO) Take 1 capsule by mouth at bedtime.     thyroid (ARMOUR) 60 MG tablet Take 120 mg by mouth daily before breakfast.     tiZANidine (ZANAFLEX) 4 MG tablet Take 4 mg by mouth as needed.     No current facility-administered medications for this visit.     Past Medical History:  Diagnosis Date   Hypertension    Osteoarthritis    Thyroid disease     Past Surgical History:  Procedure Laterality Date   ABDOMINAL  HYSTERECTOMY     APPENDECTOMY     CYST REMOVAL HAND     SKIN GRAFT     THYROIDECTOMY, PARTIAL      Social History   Socioeconomic History   Marital status: Single    Spouse name: Not on file   Number of children: Not on file   Years of education: Not on file   Highest education level: Not on file  Occupational History   Not on file  Tobacco Use   Smoking status: Unknown   Smokeless tobacco: Not on file  Substance and Sexual Activity   Alcohol use: Not on file   Drug use: Not on file   Sexual activity: Not on file  Other Topics Concern   Not on file  Social History Narrative   Not on file   Social Drivers of Health   Financial Resource Strain: Not on file  Food Insecurity: Not on file  Transportation Needs: Not on file  Physical Activity: Not on file  Stress: Not on file  Social Connections: Not on file  Intimate Partner Violence: Not on file    Family History  Problem Relation Age of Onset   Heart attack Father    Breast cancer Neg Hx     ROS: no fevers or chills, productive cough, hemoptysis, dysphasia, odynophagia, melena,  hematochezia, dysuria, hematuria, rash, seizure activity, orthopnea, PND, pedal edema, claudication. Remaining systems are negative.  Physical Exam: Well-developed well-nourished in no acute distress.  Skin is warm and dry.  HEENT is normal.  Neck is supple.  Chest is clear to auscultation with normal expansion.  Cardiovascular exam is regular rate and rhythm.  Abdominal exam nontender or distended. No masses palpated. Extremities show 1+ edema. neuro grossly intact  EKG Interpretation Date/Time:  Monday April 30 2023 08:37:28 EDT Ventricular Rate:  66 PR Interval:  174 QRS Duration:  82 QT Interval:  392 QTC Calculation: 410 R Axis:   -12  Text Interpretation: Normal sinus rhythm Low voltage QRS Confirmed by Olga Millers ([id number]) on 04/30/2023 8:53:32 AM     A/P  1 Palpitations-previous monitor as outlined above;  continue metoprolol, cardizem and flecanide. LV function normal. Symptoms reasonably well controlled.   2 HTN-BP mildly elevated.  Will follow and increase medications as needed.  Olga Millers, MD

## 2023-04-30 ENCOUNTER — Encounter: Payer: Self-pay | Admitting: Cardiology

## 2023-04-30 ENCOUNTER — Ambulatory Visit: Payer: Medicare Other | Attending: Cardiology | Admitting: Cardiology

## 2023-04-30 VITALS — BP 140/90 | HR 66 | Ht 67.0 in | Wt 316.4 lb

## 2023-04-30 DIAGNOSIS — I471 Supraventricular tachycardia, unspecified: Secondary | ICD-10-CM | POA: Insufficient documentation

## 2023-04-30 MED ORDER — DILTIAZEM HCL ER COATED BEADS 120 MG PO CP24: 120.0000 mg | ORAL_CAPSULE | Freq: Every day | ORAL | 3 refills | Status: DC

## 2023-04-30 MED ORDER — FLECAINIDE ACETATE 50 MG PO TABS: ORAL_TABLET | ORAL | 3 refills | Status: AC

## 2023-04-30 MED ORDER — BISOPROLOL FUMARATE 5 MG PO TABS: 2.5000 mg | ORAL_TABLET | Freq: Every day | ORAL | 3 refills | Status: DC

## 2023-04-30 NOTE — Patient Instructions (Signed)
   Follow-Up: At [facility], you and your health needs are our priority.  As part of our continuing mission to provide you with exceptional heart care, our providers are all part of one team.  This team includes your primary Cardiologist (physician) and Advanced Practice Providers or APPs (Physician Assistants and Nurse Practitioners) who all work together to provide you with the care you need, when you need it.  Your next appointment:   12 month(s)  Provider:   Olga Millers, MD            1st Floor: - Lobby - Registration  - Pharmacy  - Lab - Cafe  2nd Floor: - PV Lab - Diagnostic Testing (echo, CT, nuclear med)  3rd Floor: - Vacant  4th Floor: - TCTS (cardiothoracic surgery) - AFib Clinic - Structural Heart Clinic - Vascular Surgery  - Vascular Ultrasound  5th Floor: - [facility] (general and EP) - Clinical Pharmacy for coumadin, hypertension, lipid, weight-loss medications, and med management appointments    Valet parking services will be available as well.

## 2023-05-08 ENCOUNTER — Other Ambulatory Visit: Payer: Self-pay | Admitting: Internal Medicine

## 2023-05-08 DIAGNOSIS — Z1231 Encounter for screening mammogram for malignant neoplasm of breast: Secondary | ICD-10-CM

## 2023-05-15 ENCOUNTER — Ambulatory Visit
Admission: RE | Admit: 2023-05-15 | Discharge: 2023-05-15 | Disposition: A | Discharge status: Home / Self Care | Source: Ambulatory Visit

## 2023-05-15 DIAGNOSIS — Z1231 Encounter for screening mammogram for malignant neoplasm of breast: Secondary | ICD-10-CM

## 2023-05-22 DIAGNOSIS — Z0000 Encounter for general adult medical examination without abnormal findings: Secondary | ICD-10-CM | POA: Diagnosis not present

## 2023-05-22 DIAGNOSIS — I10 Essential (primary) hypertension: Secondary | ICD-10-CM | POA: Diagnosis not present

## 2023-05-22 DIAGNOSIS — Z1331 Encounter for screening for depression: Secondary | ICD-10-CM | POA: Diagnosis not present

## 2023-05-22 DIAGNOSIS — K8020 Calculus of gallbladder without cholecystitis without obstruction: Secondary | ICD-10-CM | POA: Diagnosis not present

## 2023-05-22 DIAGNOSIS — N200 Calculus of kidney: Secondary | ICD-10-CM | POA: Diagnosis not present

## 2023-05-22 DIAGNOSIS — H547 Unspecified visual loss: Secondary | ICD-10-CM | POA: Diagnosis not present

## 2023-05-22 DIAGNOSIS — F3342 Major depressive disorder, recurrent, in full remission: Secondary | ICD-10-CM | POA: Diagnosis not present

## 2023-05-22 DIAGNOSIS — R2689 Other abnormalities of gait and mobility: Secondary | ICD-10-CM | POA: Diagnosis not present

## 2023-05-22 DIAGNOSIS — G8929 Other chronic pain: Secondary | ICD-10-CM | POA: Diagnosis not present

## 2023-05-22 DIAGNOSIS — E890 Postprocedural hypothyroidism: Secondary | ICD-10-CM | POA: Diagnosis not present

## 2023-05-22 DIAGNOSIS — M5441 Lumbago with sciatica, right side: Secondary | ICD-10-CM | POA: Diagnosis not present

## 2023-05-22 DIAGNOSIS — Z23 Encounter for immunization: Secondary | ICD-10-CM | POA: Diagnosis not present

## 2023-06-26 DIAGNOSIS — E559 Vitamin D deficiency, unspecified: Secondary | ICD-10-CM | POA: Diagnosis not present

## 2023-06-26 DIAGNOSIS — E890 Postprocedural hypothyroidism: Secondary | ICD-10-CM | POA: Diagnosis not present

## 2023-06-26 DIAGNOSIS — H547 Unspecified visual loss: Secondary | ICD-10-CM | POA: Diagnosis not present

## 2023-06-26 DIAGNOSIS — I10 Essential (primary) hypertension: Secondary | ICD-10-CM | POA: Diagnosis not present

## 2023-06-26 DIAGNOSIS — R2689 Other abnormalities of gait and mobility: Secondary | ICD-10-CM | POA: Diagnosis not present

## 2023-06-26 DIAGNOSIS — F419 Anxiety disorder, unspecified: Secondary | ICD-10-CM | POA: Diagnosis not present

## 2023-06-26 DIAGNOSIS — K760 Fatty (change of) liver, not elsewhere classified: Secondary | ICD-10-CM | POA: Diagnosis not present

## 2023-09-06 DIAGNOSIS — H47323 Drusen of optic disc, bilateral: Secondary | ICD-10-CM | POA: Diagnosis not present

## 2023-09-06 DIAGNOSIS — H2513 Age-related nuclear cataract, bilateral: Secondary | ICD-10-CM | POA: Diagnosis not present

## 2023-10-25 DIAGNOSIS — H547 Unspecified visual loss: Secondary | ICD-10-CM | POA: Diagnosis not present

## 2023-10-25 DIAGNOSIS — E890 Postprocedural hypothyroidism: Secondary | ICD-10-CM | POA: Diagnosis not present

## 2023-10-25 DIAGNOSIS — E559 Vitamin D deficiency, unspecified: Secondary | ICD-10-CM | POA: Diagnosis not present

## 2023-10-25 DIAGNOSIS — I10 Essential (primary) hypertension: Secondary | ICD-10-CM | POA: Diagnosis not present

## 2023-10-25 DIAGNOSIS — M150 Primary generalized (osteo)arthritis: Secondary | ICD-10-CM | POA: Diagnosis not present

## 2023-10-25 DIAGNOSIS — E538 Deficiency of other specified B group vitamins: Secondary | ICD-10-CM | POA: Diagnosis not present

## 2023-10-25 DIAGNOSIS — R262 Difficulty in walking, not elsewhere classified: Secondary | ICD-10-CM | POA: Diagnosis not present

## 2023-11-06 DIAGNOSIS — Z86018 Personal history of other benign neoplasm: Secondary | ICD-10-CM | POA: Diagnosis not present

## 2023-11-06 DIAGNOSIS — K5900 Constipation, unspecified: Secondary | ICD-10-CM | POA: Diagnosis not present

## 2023-11-06 DIAGNOSIS — I471 Supraventricular tachycardia, unspecified: Secondary | ICD-10-CM | POA: Diagnosis not present

## 2023-11-06 DIAGNOSIS — I10 Essential (primary) hypertension: Secondary | ICD-10-CM | POA: Diagnosis not present

## 2023-11-13 DIAGNOSIS — R350 Frequency of micturition: Secondary | ICD-10-CM | POA: Diagnosis not present

## 2023-11-13 DIAGNOSIS — U071 COVID-19: Secondary | ICD-10-CM | POA: Diagnosis not present

## 2023-11-13 DIAGNOSIS — J069 Acute upper respiratory infection, unspecified: Secondary | ICD-10-CM | POA: Diagnosis not present

## 2023-11-13 DIAGNOSIS — I10 Essential (primary) hypertension: Secondary | ICD-10-CM | POA: Diagnosis not present

## 2023-12-04 DIAGNOSIS — Z860101 Personal history of adenomatous and serrated colon polyps: Secondary | ICD-10-CM | POA: Diagnosis not present

## 2023-12-04 DIAGNOSIS — Z09 Encounter for follow-up examination after completed treatment for conditions other than malignant neoplasm: Secondary | ICD-10-CM | POA: Diagnosis not present

## 2023-12-04 DIAGNOSIS — K5730 Diverticulosis of large intestine without perforation or abscess without bleeding: Secondary | ICD-10-CM | POA: Diagnosis not present

## 2023-12-18 ENCOUNTER — Encounter: Payer: Self-pay | Admitting: Cardiology

## 2023-12-18 DIAGNOSIS — Z23 Encounter for immunization: Secondary | ICD-10-CM | POA: Diagnosis not present

## 2023-12-18 DIAGNOSIS — Z8616 Personal history of COVID-19: Secondary | ICD-10-CM | POA: Diagnosis not present

## 2023-12-18 DIAGNOSIS — E559 Vitamin D deficiency, unspecified: Secondary | ICD-10-CM | POA: Diagnosis not present

## 2023-12-18 DIAGNOSIS — I10 Essential (primary) hypertension: Secondary | ICD-10-CM | POA: Diagnosis not present

## 2023-12-18 DIAGNOSIS — I471 Supraventricular tachycardia, unspecified: Secondary | ICD-10-CM | POA: Diagnosis not present

## 2023-12-18 DIAGNOSIS — M150 Primary generalized (osteo)arthritis: Secondary | ICD-10-CM | POA: Diagnosis not present

## 2023-12-24 MED ORDER — DILTIAZEM HCL ER COATED BEADS 180 MG PO CP24: 180.0000 mg | ORAL_CAPSULE | Freq: Every day | ORAL | 3 refills | Status: DC

## 2023-12-25 MED ORDER — DILTIAZEM HCL ER COATED BEADS 180 MG PO CP24
180.0000 mg | ORAL_CAPSULE | Freq: Every day | ORAL | 3 refills | Status: AC
Start: 2023-12-25 — End: ?

## 2023-12-25 MED ORDER — BISOPROLOL FUMARATE 5 MG PO TABS: 2.5000 mg | ORAL_TABLET | Freq: Every day | ORAL | 3 refills | Status: AC

## 2023-12-25 NOTE — Addendum Note (Signed)
 Addended by: JOSHUA ANDREZ PARAS on: 12/25/2023 08:17 AM   Modules accepted: Orders

## 2024-01-31 ENCOUNTER — Encounter: Payer: Self-pay | Admitting: Cardiology

## 2024-02-01 ENCOUNTER — Encounter: Payer: Self-pay | Admitting: Cardiology

## 2024-05-05 ENCOUNTER — Ambulatory Visit: Admitting: Cardiology
